# Patient Record
Sex: Male | Born: 1955 | Race: White | Hispanic: No | Marital: Married | Smoking: Former smoker
Health system: Southern US, Community
[De-identification: ages and names within clinical notes are randomized; demographics above are authoritative.]

## PROBLEM LIST (undated history)

## (undated) DIAGNOSIS — N529 Male erectile dysfunction, unspecified: Secondary | ICD-10-CM

## (undated) DIAGNOSIS — E785 Hyperlipidemia, unspecified: Secondary | ICD-10-CM

## (undated) DIAGNOSIS — J45909 Unspecified asthma, uncomplicated: Secondary | ICD-10-CM

## (undated) DIAGNOSIS — I10 Essential (primary) hypertension: Secondary | ICD-10-CM

## (undated) DIAGNOSIS — H9193 Unspecified hearing loss, bilateral: Secondary | ICD-10-CM

## (undated) DIAGNOSIS — M76829 Posterior tibial tendinitis, unspecified leg: Secondary | ICD-10-CM

## (undated) HISTORY — PX: FINGER SURGERY: SHX640

## (undated) HISTORY — DX: Hyperlipidemia, unspecified: E78.5

## (undated) HISTORY — DX: Unspecified hearing loss, bilateral: H91.93

## (undated) HISTORY — DX: Male erectile dysfunction, unspecified: N52.9

## (undated) HISTORY — DX: Posterior tibial tendinitis, unspecified leg: M76.829

## (undated) HISTORY — DX: Essential (primary) hypertension: I10

## (undated) HISTORY — DX: Unspecified asthma, uncomplicated: J45.909

## (undated) HISTORY — PX: SPHINCTEROTOMY: SHX5279

---

## 1988-07-14 HISTORY — PX: KNEE ARTHROSCOPY W/ ACL RECONSTRUCTION: SHX1858

## 1999-02-01 ENCOUNTER — Encounter: Payer: Self-pay | Admitting: Radiology

## 1999-02-01 ENCOUNTER — Ambulatory Visit (HOSPITAL_COMMUNITY): Admission: RE | Admit: 1999-02-01 | Discharge: 1999-02-01 | Payer: Self-pay | Admitting: Radiology

## 2004-10-28 ENCOUNTER — Ambulatory Visit (HOSPITAL_COMMUNITY): Admission: RE | Admit: 2004-10-28 | Discharge: 2004-10-28 | Payer: Self-pay | Admitting: Emergency Medicine

## 2013-09-28 ENCOUNTER — Encounter: Payer: Self-pay | Admitting: Sports Medicine

## 2013-09-28 ENCOUNTER — Ambulatory Visit (INDEPENDENT_AMBULATORY_CARE_PROVIDER_SITE_OTHER): Payer: PRIVATE HEALTH INSURANCE | Admitting: Sports Medicine

## 2013-09-28 VITALS — BP 121/79 | HR 72 | Ht 79.0 in | Wt 175.0 lb

## 2013-09-28 DIAGNOSIS — M25579 Pain in unspecified ankle and joints of unspecified foot: Secondary | ICD-10-CM

## 2013-09-28 DIAGNOSIS — M76829 Posterior tibial tendinitis, unspecified leg: Secondary | ICD-10-CM

## 2013-09-28 MED ORDER — NITROGLYCERIN 0.2 MG/HR TD PT24: 0.2000 mg | MEDICATED_PATCH | Freq: Every day | TRANSDERMAL | Status: DC

## 2013-09-28 NOTE — Patient Instructions (Signed)
Thank you for coming in today  1. X ankle helix 2. Rehab exercises 3. Shoe inserts 4. Nitro patch 5. Ice after activity  Followup 6 weeks  Nitroglycerin Protocol   Apply 1/4 nitroglycerin patch to affected area daily.  Change position of patch within the affected area every 24 hours.  You may experience a headache during the first 1-2 weeks of using the patch, these should subside.  If you experience headaches after beginning nitroglycerin patch treatment, you may take your preferred over the counter pain reliever.  Another side effect of the nitroglycerin patch is skin irritation or rash related to patch adhesive.  Please notify our office if you develop more severe headaches or rash, and stop the patch.  Tendon healing with nitroglycerin patch may require 12 to 24 weeks depending on the extent of injury.  Men should not use if taking Viagra, Cialis, or Levitra.   Do not use if you have migraines or rosacea.

## 2013-09-28 NOTE — Progress Notes (Signed)
CC: Right ankle pain HPI: Patient is a very pleasant 58 year old male who presents with right ankle pain that has been there for 7 months. She was trying to get out off of a chair in the dog bumped into him. He suffered an inversion injury to the right ankle. He had immediate pain and swelling. He rested for 3 weeks and then began to wear an ASO lace up brace. He had x-rays that were reportedly negative. He has been able to return to cycling without to much difficulty but does note that he has a lot of pain at night. Pain was initially laterally but then began to be on the medial aspect of his ankle about a week after the injury and this is where his pain is now. He denies any popping or crepitance. No sensation of instability. He has done minimal ankle rehabilitation.  ROS: As above in the HPI. All other systems are stable or negative.  OBJECTIVE: APPEARANCE:  Patient in no acute distress.The patient appeared well nourished and normally developed. HEENT: No scleral icterus. Conjunctiva non-injected Resp: Non labored Skin: No rash MSK:  Right Ankle: No visible erythema or swelling. Range of motion is full in all directions. Strength is 5/5 in all directions. Stable lateral and medial ligaments; squeeze test and kleiger test unremarkable; Talar dome mildly tender over the medial corner; No pain at base of 5th MT; No tenderness over cuboid; No tenderness over N spot or navicular prominence No tenderness on posterior aspects of lateral and medial malleolus Tender over the course of the posterior tibialis tendon Able to walk 4 steps.   Foot exam:  - On inspection, there is noted to be no swelling or deformity.  - When standing, patient has pes planus with over pronation and loss of longitudinal arch. There is also a large gap between the first and second and second and third toes suggestive of breakdown of the transverse arch. - Neurovascular status normal.  MSK US: Limited ultrasound of  the right ankle was performed today. There is a large hypoechoic fluid layer overlying the posterior tibialis tendon in both transverse and longitudinal views. There is no evidence of tendon tear or split. Talar dome visualized with no evidence of osteochondral defect from medial to lateral.   ASSESSMENT: #1. Persistent right medial ankle pain after inversion injury. He definitely has findings today of posterior tibialis tendinopathy but the most concerning feature is the fact that he has pain at night which raises concern for OCD lesion of talar dome  PLAN: We will start by rehabbing posterior tibial tendinopathy. He was given heel raise exercises. We will try a body helix compression sleeve to reduce swelling. He should ice after exercise. Nitroglycerin protocol. Sport insoles with scaphoid pad. Followup 6 weeks. May need to consider MRI if he is not improving to rule out talar dome lesion.

## 2013-11-09 ENCOUNTER — Ambulatory Visit (INDEPENDENT_AMBULATORY_CARE_PROVIDER_SITE_OTHER): Payer: PRIVATE HEALTH INSURANCE | Admitting: Sports Medicine

## 2013-11-09 ENCOUNTER — Encounter: Payer: Self-pay | Admitting: Sports Medicine

## 2013-11-09 VITALS — BP 132/75 | Ht 69.0 in | Wt 175.0 lb

## 2013-11-09 DIAGNOSIS — M76829 Posterior tibial tendinitis, unspecified leg: Secondary | ICD-10-CM

## 2013-11-09 DIAGNOSIS — J45990 Exercise induced bronchospasm: Secondary | ICD-10-CM | POA: Insufficient documentation

## 2013-11-09 MED ORDER — BECLOMETHASONE DIPROPIONATE 80 MCG/ACT IN AERS: 2.0000 | INHALATION_SPRAY | Freq: Two times a day (BID) | RESPIRATORY_TRACT | Status: DC | PRN

## 2013-11-09 NOTE — Patient Instructions (Signed)
Thank you for coming in today  1. Continue NTG patch 2. Try to build in more ankle rehab 3. Try Qvar around exercise 2 puffs 4. Followup in 6 weeks

## 2013-11-09 NOTE — Progress Notes (Signed)
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 Fortunately he says his right ankle is doing much better. Recall that we saw him for persistent medial ankle pain 7 months after inversion injury. He states that the pain is at least 50% better. Is becoming more localized just below and posterior to the medial malleolus. Now he'll he has pain with movement has no further pain at rest. He is using the nitroglycerin as well as a sport insoles. He is not doing a rehabilitation as much as he would like to. \  He has had long-standing exercise-induced asthma. He has been chronically on an albuterol inhaler or prior to exercise to control his. He notes that more recently he has had worsening of his symptoms. He will have days where he has difficulty breathing when he is out cycling but he will always have a couple hours after he finishes his ride where he has a lot of coughing. He would like to discuss further treatment options. He tried Singulair but says that it made him very fatigued.   ROS: As above in the HPI. All other systems are stable or negative.  OBJECTIVE: APPEARANCE:  Patient in no acute distress.The patient appeared well nourished and normally developed. HEENT: No scleral icterus. Conjunctiva non-injected Resp: Non labored Skin: No rash MSK:  Right Ankle: No visible erythema or swelling. Range of motion is full in all directions. Strength is 5/5 in all directions. Stable lateral and medial ligaments; squeeze test and kleiger test unremarkable; Talar dome nontender; Positive anterior drawer for laxity No sign of peroneal tendon subluxations; Negative tarsal tunnel tinel's Able to walk 4 steps.  MSK US:  not performed   ASSESSMENT:  #1. Right posterior tibialis tendinopathy, improving #2. Exercise-induced bronchospasm  PLAN #1. For the posterior tibialis tendinopathy, we will continue current treatment  plan. Continue compression sleeve, sport insoles, nitroglycerin. We are glad that he is doing so much better at this time. We will have also asked him to try to build in more of the rehabilitation exercises as this will likely help with his proprioception a regaining stability as well as treatment of the posterior tib tendinopathy.  #2. For his exercise-induced bronchospasm, add Qvar 80 mcg 2 puffs the morning prior to exercise. If this does not control his symptoms he will go up to daily use. When he returns would recommend obtaining peak flows to rule out chronic asthma.  Followup 6 weeks

## 2013-12-21 ENCOUNTER — Ambulatory Visit (INDEPENDENT_AMBULATORY_CARE_PROVIDER_SITE_OTHER): Payer: PRIVATE HEALTH INSURANCE | Admitting: Sports Medicine

## 2013-12-21 ENCOUNTER — Encounter: Payer: Self-pay | Admitting: Sports Medicine

## 2013-12-21 VITALS — BP 112/78 | Ht 69.0 in | Wt 175.0 lb

## 2013-12-21 DIAGNOSIS — M76829 Posterior tibial tendinitis, unspecified leg: Secondary | ICD-10-CM

## 2013-12-21 DIAGNOSIS — J45990 Exercise induced bronchospasm: Secondary | ICD-10-CM

## 2013-12-21 HISTORY — DX: Posterior tibial tendinitis, unspecified leg: M76.829

## 2013-12-21 MED ORDER — NITROGLYCERIN 0.2 MG/HR TD PT24: 0.2000 mg | MEDICATED_PATCH | Freq: Every day | TRANSDERMAL | Status: DC

## 2013-12-21 NOTE — Assessment & Plan Note (Signed)
On examination this is much better with no real findings  On [place] fluid in sheath has normalized  Tx 1 more month and then wean off NTG  Reck if probs

## 2013-12-21 NOTE — Progress Notes (Signed)
Peak flow readings: 470/490/500

## 2013-12-21 NOTE — Assessment & Plan Note (Signed)
Improved with treatment for exercise  However, with low baseline values he should use a PEFR to monitor levels and also do daily Tx  See if this reverses his sxs over next 2 to 3 mos

## 2013-12-21 NOTE — Patient Instructions (Signed)
Thank you for coming in today  1. Rehab exercises (especially proprioception- 1 foot, cone touch, wall bounce on 1 foot) 2. Nitro patch 3. Follow up 4-6 weeks if not completely resolved  Asthma 1. Monitor Peak flow in morning and before and after exercise. Would encourage qvar daily based off peak flow around 490 in office today.   Nitroglycerin Protocol   Apply 1/4 nitroglycerin patch to affected area daily.  Change position of patch within the affected area every 24 hours.  You may experience a headache during the first 1-2 weeks of using the patch, these should subside.  If you experience headaches after beginning nitroglycerin patch treatment, you may take your preferred over the counter pain reliever.  Another side effect of the nitroglycerin patch is skin irritation or rash related to patch adhesive.  Please notify our office if you develop more severe headaches or rash, and stop the patch.  Tendon healing with nitroglycerin patch may require 12 to 24 weeks depending on the extent of injury.  Men should not use if taking Viagra, Cialis, or Levitra.   Do not use if you have migraines or rosacea.

## 2013-12-21 NOTE — Progress Notes (Signed)
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 He has no pain at rest but can palpate some pain. With movement he has mild pain mianly just below and posterior to medial malleolus. He has trouble fitting his sports insoles into his shoes. He is not interested in thinner bikers insoles at present.  Nitroglycerin use- 6/7 days a week Rehab- 5/7 days a week and doing some proprioception  Also following up long-standing exercise-induced asthma. He did pick up qvar and is using 2 hours before exercise and then albuterol 20 minutes before. He discussed this regimen with Dr. Kendrick Fries of pulmonary. His main symptoms were coughing spells after exercise and heavy breathing during exercise. He has had a significant improvement on current regimen. Singulair had been helpful in past but produced too much fatigue.   ROS:  As above in the HPI with additions no clicking/popping/joint instability All other systems are stable or negative.  OBJECTIVE: APPEARANCE:  Patient in no acute distress.The patient appeared well nourished and normally developed. Resp: Non labored. Peak flow readings averaging 487 (expected 590) Skin: No rash or erythema over leg MSK:  Right Ankle: No visible erythema or swelling. Patient with mild tenderness just behind and below medial malleolus Range of motion is full in all directions. Eversion and plantar flexion produce some cramping just below medial malleolus. Also great toe flexion produces similar cramping. NOt reproduced on opposite foot.  Strength is 5/5 in all directions. Stable lateral and medial ligaments; squeeze test and kleiger test unremarkable; Talar dome nontender; Positive anterior drawer for laxity previously noted, minor laxity noted today Negative tarsal tunnel tinel's Able to walk 4 steps with ease  MSK [place]:   Rt ankle- shows significant reduction in hypoechoic region along posterior tibialis tendon. Also, no increased blood flow noted in tendon. Represents healing of posterior tibialis tendinopathy  ASSESSMENT:  #1. Right posterior tibialis tendinopathy, improving #2. Exercise-induced bronchospasm  PLAN #1. For the posterior tibialis tendinopathy, we will continue current treatment plan. Continue nitroglycerin and rehab exercises. Has been 2 months of therapy so will continue nitroglycerin one more month. Discussed specific home exercise program for proprioception (see avs). Follow up in 1 month if not fully recovered.   #2.  For his exercise-induced bronchospasm, suspect this is actually chronic asthma (peak flow avg 487 when expected 590). Advised patient to use Qvar on daily basis instead of prn before exercise. Follow up in 1 month or with primary care provider. Could consider formal PFTs.

## 2016-04-28 ENCOUNTER — Ambulatory Visit (INDEPENDENT_AMBULATORY_CARE_PROVIDER_SITE_OTHER): Payer: PRIVATE HEALTH INSURANCE | Admitting: Family Medicine

## 2016-04-28 ENCOUNTER — Encounter: Payer: Self-pay | Admitting: Family Medicine

## 2016-04-28 DIAGNOSIS — E785 Hyperlipidemia, unspecified: Secondary | ICD-10-CM | POA: Diagnosis not present

## 2016-04-28 NOTE — Progress Notes (Signed)
Medical Nutrition Therapy:  Appt start time: 0900 end time:  1000.  Assessment:  Primary concerns today: Weight management and cholesterol management.  (currently medication-controlled).   Timothy David is an avid cyclist, and would like some nutrition recommendations for optimizing performance as well as for cholesterol mgmt and family nutrition.  He has 3 children at home currently (oldest 9716, youngest 4910).   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  He works variable AM or evening shifts.    Learning Readiness: Ready  Usual eating pattern includes 3 meals and 1-2 snacks per day. Frequent foods and beverages include water, 2 c coffee, beer/wine 5 X wk, cereal 3 X wk, 1 boiled egg 3 X wk, protein-starch-veg dinners.  Avoided foods include none.   Usual physical activity includes cycling 30-40 mi ~4 X wk; 100-125 mi per week (6-8 hrs).  Eats ~90 min pre-ride, e.g., 1 egg, cheese, Eng muffin w/ butter, maybe with 1 slice ham.  During his ride, he has 2 dates with 2 T peanuts & 2 bottles carb replacement.    Eats out ~3 X mo; does a lot of home cooking, and usually takes dinner leftovers for meals at work the next day.   24-hr recall: (Up at 8 AM) B (8:30 AM)-   1 espresso, 3.5 c frosted Mini-Whts & Shr Wht, 1 c sk milk Snk ( AM)-    L (12:30 PM)-  2 c chx parmiagiana (2/3 c noodles), water To work 2 PM - 1 AM Snk ( PM)-   D (7 PM)-  1.5 c [place] stew, 1 apple, water Snk (1:30)-  ~1 c peanuts in shell, 12 oz beer Typical day? Yes.  Probably eats less when working.    Progress Towards Goal(s):  In progress.   Nutritional Diagnosis:  NB-1.1 Food and nutrition-related knowledge deficit As related to best dietary choices for optimizing both performance and cholesterol levels.  As evidenced by expressed interest and reported knowledge gap.    Intervention:  Education.  Handouts given during visit include:  AVS  Demonstrated degree of understanding via:  Teach  Back  Barriers to learning/adherence to lifestyle change: No significant barriers perceived.  Pt is highly motivated.   Monitoring/Evaluation:  Dietary intake, exercise, and body weight prn.

## 2016-04-28 NOTE — Patient Instructions (Addendum)
-   On any rides >60 min, plan on carb replacement during your ride.  60 grams of carb per hour is the max most people can absorb, so it probably doesn't make sense to consume a lot more than this per hour.    - Here is a carb-protein drink you can use for riding:  2 tbsp Pro-stat pro supplement + 10 oz [employer] Foods 365 lemonade; dilute with water to ~20-24 oz total.    - This drink provides roughly 3:1 carb:protein.  Recommended for aerobic exercise is a ratio of at least 3:1, and up to 4:1.   - Post-race eating within 30 min is ideal.  Make sure it's a good mix of carb and protein.   - Consider using more beets, especially for a couple weeks leading up to any significant riding event.   - [place] also has high levels of natural nitrates; google "nitrate levels of veg's/plant foods." - Protein intake:  Aim for minimum of 25 grams per meal.  (1 oz of meat/fish/poultry = 7 grams.) - Popcorn:  Fine for an occasional snack; best if you pop your own from dry, using olive oil.  (Avoid trans fat.) - Fatigue on hills:    - You want to be as lean as possible, without sacrificing power.    - Some weight training for quads and gluts will also be helpful.    - Obtain twice as many veg's as protein or carbohydrate foods for both lunch and dinner.  - Pay attention to the amount of veg's you get at each meal.    - Helpful for both wt management and cholesterol control.   - Make a list of 7-10 dinner meals that taste good, are relatively quick and easy to prepare, and that meet your nutritional needs.  Use this as a basis for shopping, so you can make one of these meals any time.  Bring your list to your follow-up appointment for review.

## 2016-06-26 DIAGNOSIS — R0609 Other forms of dyspnea: Principal | ICD-10-CM

## 2016-06-26 DIAGNOSIS — R0600 Dyspnea, unspecified: Secondary | ICD-10-CM | POA: Insufficient documentation

## 2016-06-29 DIAGNOSIS — R0789 Other chest pain: Secondary | ICD-10-CM | POA: Insufficient documentation

## 2016-07-01 ENCOUNTER — Encounter: Payer: Self-pay | Admitting: Interventional Cardiology

## 2016-07-01 ENCOUNTER — Ambulatory Visit (INDEPENDENT_AMBULATORY_CARE_PROVIDER_SITE_OTHER): Payer: PRIVATE HEALTH INSURANCE | Admitting: Interventional Cardiology

## 2016-07-01 VITALS — BP 118/76 | HR 85 | Ht 69.0 in | Wt 170.4 lb

## 2016-07-01 DIAGNOSIS — N529 Male erectile dysfunction, unspecified: Secondary | ICD-10-CM

## 2016-07-01 DIAGNOSIS — R0609 Other forms of dyspnea: Secondary | ICD-10-CM

## 2016-07-01 DIAGNOSIS — E784 Other hyperlipidemia: Secondary | ICD-10-CM

## 2016-07-01 DIAGNOSIS — R0789 Other chest pain: Secondary | ICD-10-CM | POA: Diagnosis not present

## 2016-07-01 DIAGNOSIS — E7849 Other hyperlipidemia: Secondary | ICD-10-CM

## 2016-07-01 NOTE — Progress Notes (Signed)
Cardiology Office Note    Date:  07/01/2016   ID:  Timothy David, DOB [birth year], MRN [id number]  PCP:  Garlan FillersPATERSON,DANIEL G, MD  Cardiologist: Lesleigh NoeHenry W Trice Aspinall III, MD   Chief Complaint  Patient presents with  . 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 He cycles up to 2 hours 4 or 5 times per week. Over the past year he has had episodes of extreme dyspnea at heart rates above 160 bpm. He feels as though he can't inhale. There is no discomfort. Once present he has to stop exerting himself. It gradually resolves over a minute or 2. There is no associated discomfort. He has never lightheaded or dizzy with it. There is no palpitation or sensation of arrhythmia. The situation is different than what he feels with "asthma".    Past Medical History:  Diagnosis Date  . Asthma    exercise - induced  . Dyslipidemia   . ED (erectile dysfunction)   . Hearing loss, bilateral   . Hypertension, benign   . Tibialis posterior tendinitis 12/21/2013   Documented on US Tx with NTG     Past Surgical History:  Procedure Laterality Date  . FINGER SURGERY     left index finger dislocation  . KNEE ARTHROSCOPY W/ ACL RECONSTRUCTION  1990  . SPHINCTEROTOMY     perirectal abscess incision and drainage    Current Medications: Outpatient Medications Prior to Visit  Medication Sig Dispense Refill  . albuterol (PROVENTIL HFA;VENTOLIN HFA) 108 (90 Base) MCG/ACT inhaler Inhale 2 puffs into the lungs every 4 (four) hours as needed for wheezing or shortness of breath.    . beclomethasone (QVAR) 80 MCG/ACT inhaler Inhale 2 puffs into the lungs 2 (two) times daily as needed (prior to exercise for EIB). 1 Inhaler 1  . finasteride (PROSCAR) 5 MG tablet Take 5 mg  by mouth daily.    . montelukast (SINGULAIR) 10 MG tablet Take 10 mg by mouth at bedtime.    . pravastatin (PRAVACHOL) 10 MG tablet Take 10 mg by mouth daily.    Marland Kitchen. SILDENAFIL CITRATE PO Take 20 mg by mouth. TAKE 1-5 TABS BY MOUTH DAILY PRN    . tamsulosin (FLOMAX) 0.4 MG CAPS capsule Take 0.4 mg by mouth.    . zaleplon (SONATA) 10 MG capsule Take 10 mg by mouth at bedtime as needed for sleep.     No facility-administered medications prior to visit.      Allergies:   Patient has no known allergies.   Social History   Social History  . Marital status: Married    Spouse name: N/A  . Number of children: N/A  . Years of education: N/A   Social History Main Topics  . Smoking status: Former Smoker    Packs/day: 3.00    Types: Cigarettes  . Smokeless tobacco: Never Used  . Alcohol use 1.2 oz/week    2 Cans of beer per week     Comment: 4 days a week  . Drug use: No  . Sexual activity: Yes     Comment: [name]   Other Topics Concern  . None   Social History Narrative  . None     Family History:  The patient's  family history includes Cancer in his mother and sister; Healthy in his brother, sister, sister, sister, and sister; Heart disease in his father; Hypertension in his father; Other in his sister.   ROS:   Please see the history of present illness.    No real complaints. He has lost daily 30 pounds since he has been exercising. He does have exercise-induced asthma. Also has a history of hyperlipidemia, erectile dysfunction, and prior history of elevated blood pressure before weight loss.  All other systems reviewed and are negative.   PHYSICAL EXAM:   VS:  BP 118/76 (BP Location: Left Arm)   Pulse 85   Ht 5\' 9"  (1.753 m)   Wt 170 lb 6.4 oz (77.3 kg)   BMI 25.16 kg/m    GEN: Well nourished, well developed, in no acute distress  HEENT: normal  Neck: no JVD, carotid bruits, or masses Cardiac: RRR; no murmurs, rubs, or gallops,no edema  Respiratory:  clear to auscultation  bilaterally, normal work of breathing GI: soft, nontender, nondistended, + BS MS: no deformity or atrophy  Skin: warm and dry, no rash Neuro:  Alert and Oriented x 3, Strength and sensation are intact Psych: euthymic mood, full affect  Wt Readings from Last 3 Encounters:  07/01/16 170 lb 6.4 oz (77.3 kg)  04/28/16 170 lb (77.1 kg)  12/21/13 175 lb (79.4 kg)      Studies/Labs Reviewed:   EKG:  EKG  Normal sinus rhythm with normal appearance.  Recent Labs: No results found for requested labs within last 8760 hours.   Lipid Panel No results found for: CHOL, TRIG, HDL, CHOLHDL, VLDL, LDLCALC, LDLDIRECT  Additional studies/ records that were reviewed today include:  No functional testing or prior chest CT studies.    ASSESSMENT:    1. Dyspnea on exertion   2. Chest discomfort   3. Other hyperlipidemia   4. Erectile dysfunction, unspecified erectile dysfunction type      PLAN:  In order of problems listed above:  1. This symptom occurs at high-end exercise usually when heart rates are greater than 160 bpm and have been sustained in that range for some time. There is no chest discomfort. We'll perform a stress Myoview seeking to achieve a heart rate of at least 163 prior to injecting the radionuclide. If he is unable to achieve the target heart rate for injection, we should converted to Lexiscan. 2. Occasional tightness in the chest CT feels is related to asthma as a goes away with his bronchodilator. 3. Current lipid profile is [facility].    Medication Adjustments/Labs and Tests Ordered: Current medicines are reviewed at length with the patient today.  Concerns regarding medicines are outlined above.  Medication changes, Labs and Tests ordered today are listed in the Patient Instructions below. Patient Instructions  Medication Instructions:  None  Labwork: None  Testing/Procedures: Your physician has requested that you  have en exercise stress myoview. For further information please visit [email or web address]. Please follow instruction sheet, as given.   Follow-Up: Your physician recommends that you schedule a follow-up appointment as needed with Dr. Katrinka BlazingSmith.    Any Other Special Instructions Will Be Listed Below (If Applicable).     If you need a refill on your cardiac medications before your next appointment, please call your pharmacy.      Signed, Lesleigh NoeHenry W Laiyla Slagel III, MD  07/01/2016 3:53 PM    [facility] [address], [place], [place]  [address] Phone: [phone]; Fax: [phone]

## 2016-07-01 NOTE — Patient Instructions (Signed)
Medication Instructions:  None  Labwork: None  Testing/Procedures: Your physician has requested that you have en exercise stress myoview. For further information please visit [email or web address]. Please follow instruction sheet, as given.   Follow-Up: Your physician recommends that you schedule a follow-up appointment as needed with Dr. Smith.    Any Other Special Instructions Will Be Listed Below (If Applicable).     If you need a refill on your cardiac medications before your next appointment, please call your pharmacy.   

## 2016-07-10 ENCOUNTER — Telehealth (HOSPITAL_COMMUNITY): Payer: Self-pay | Admitting: *Deleted

## 2016-07-10 NOTE — Telephone Encounter (Signed)
Left message on voicemail in reference to upcoming appointment scheduled for 07/15/16. Phone number given for a call back so details instructions can be given. Ricky AlaSmith, Sie Formisano Jacqueline

## 2016-07-15 ENCOUNTER — Ambulatory Visit (HOSPITAL_COMMUNITY): Payer: PRIVATE HEALTH INSURANCE | Attending: Cardiovascular Disease

## 2016-07-15 DIAGNOSIS — I10 Essential (primary) hypertension: Secondary | ICD-10-CM | POA: Diagnosis not present

## 2016-07-15 DIAGNOSIS — R0609 Other forms of dyspnea: Secondary | ICD-10-CM | POA: Diagnosis not present

## 2016-07-15 DIAGNOSIS — R0789 Other chest pain: Secondary | ICD-10-CM | POA: Diagnosis present

## 2016-07-15 LAB — MYOCARDIAL PERFUSION IMAGING
CHL CUP MPHR: 159 {beats}/min
CHL CUP NUCLEAR SDS: 0
CHL CUP NUCLEAR SSS: 0
CHL CUP RESTING HR STRESS: 62 {beats}/min
CSEPED: 13 min
CSEPEW: 16.2 METS
Exercise duration (sec): 30 s
LHR: 0.3
LV dias vol: 126 mL (ref 62–150)
LV sys vol: 55 mL
Peak HR: 171 {beats}/min
Percent HR: 107 %
SRS: 0
TID: 0.99

## 2016-07-15 MED ORDER — TECHNETIUM TC 99M TETROFOSMIN IV KIT
10.4000 | PACK | Freq: Once | INTRAVENOUS | Status: AC | PRN
  Administered 2016-07-15: 10.4 via INTRAVENOUS
  Filled 2016-07-15: qty 11

## 2016-07-15 MED ORDER — TECHNETIUM TC 99M TETROFOSMIN IV KIT
31.9000 | PACK | Freq: Once | INTRAVENOUS | Status: AC | PRN
  Administered 2016-07-15: 31.9 via INTRAVENOUS
  Filled 2016-07-15: qty 32

## 2016-07-21 ENCOUNTER — Telehealth: Payer: Self-pay | Admitting: Interventional Cardiology

## 2016-07-21 NOTE — Telephone Encounter (Signed)
Informed pt of stress test results. Pt verbalized understanding. 

## 2016-07-21 NOTE — Telephone Encounter (Signed)
New Message     [facility]

## 2016-08-11 ENCOUNTER — Encounter: Payer: Self-pay | Admitting: Pulmonary Disease

## 2016-08-11 ENCOUNTER — Ambulatory Visit (INDEPENDENT_AMBULATORY_CARE_PROVIDER_SITE_OTHER): Payer: PRIVATE HEALTH INSURANCE | Admitting: Pulmonary Disease

## 2016-08-11 VITALS — BP 128/72 | HR 78 | Ht 69.0 in | Wt 174.0 lb

## 2016-08-11 DIAGNOSIS — J45990 Exercise induced bronchospasm: Secondary | ICD-10-CM

## 2016-08-11 DIAGNOSIS — R011 Cardiac murmur, unspecified: Secondary | ICD-10-CM | POA: Diagnosis not present

## 2016-08-11 DIAGNOSIS — R0602 Shortness of breath: Secondary | ICD-10-CM

## 2016-08-11 NOTE — Assessment & Plan Note (Signed)
Timothy David has experienced shortness of breath on high intensity exercise mostly infrequently but of an increasing rate of occurrence over the last several months. On physical exam today his lungs are clear to auscultation and a recent cardiac stress test was within normal limits. He does have a slight flow murmur on exam, see below.  I explained to him today that the differential diagnosis either includes exercise-induced bronchospasm or some sort of vocal cord dysfunction. Because of the raspiness he feels in his voice after exercising this may be the case.  Plan: The best test moving forward is a cardiopulmonary exercise test. This will give us pre-and post spirometry testing as well as will give a full assessment of his pulmonary function. Continue QVar for now Continue Albuterol for now If there is evidence of variable upper airflow obstruction we can [facility]

## 2016-08-11 NOTE — Assessment & Plan Note (Signed)
Slight aortic area heart murmur heard on exam.  Plan: Echocardiogram to evaluate for aortic stenosis or mycocardial hypertrophy

## 2016-08-11 NOTE — Assessment & Plan Note (Signed)
As above.

## 2016-08-11 NOTE — Patient Instructions (Signed)
We will arrange for a cardiopulmonary exercise test to evaluate the cause of shortness of breath you have experienced with high intensity exercise  We will also arrange for an echocardiogram to evaluate the heart murmur and call you with the results  We will see you back in 3-4 weeks or sooner if needed; overbook OK

## 2016-08-11 NOTE — Progress Notes (Signed)
Subjective:    Patient ID: Timothy David, male    DOB: [birth year], 61 y.o.   MRN: [id number]  HPI Chief Complaint  Patient presents with  . Advice Only    self referral for exercise enduced asthma- worsening dyspnea with high exertion (heartrate at or above 160) while cycling.    Carroll is here to see me today for evaluation of shortness of breath with high intensity exercise. [place] and smoked briefly when he was in his early 46s but never on a regular basis. He says that for most of his life he has experienced exercise-induced bronchospasm but he's never had asthma. He says that he had allergic rhinitis as a child and as an adult which is brought on typically by dust and pollen.  For most of his life he's managed his exercise induced bronchospasm with albuterol. He started cycling more regularly and intensely about 4 years ago and he has noticed a slight uptake in symptoms since then.  He says that he typically pretreats with albuterol prior to exercising. For the last 18 months cc Qvar 160 g at night and then on some days prior to long exercise routine. Specifically he says that he will experience a mild sensation of shortness of breath and chest tightness a few minutes into the cycling ride. When his heart rate is in the 130 to 150 bpm range he typically does not have symptoms of shortness of breath. However, sometimes when his heart rate goes to the 160 range he will begin to experience chest tightness and shortness of breath. He has to back off on his cycling cadence and intensity of cycling in the symptom will go away in about 2-3 minutes. He says that he also experienced this while performing vigorous activity at work recently. In general, he says that this is an infrequent occurrence in that typically when his heart rate Hicks the 160 bpm range she does not have this problem, but overall it has occurred more frequently in the last 6 months.  He had a cardiac stress test  which showed no evidence of coronary ischemia.    Past Medical History:  Diagnosis Date  . Asthma    exercise - induced  . Dyslipidemia   . ED (erectile dysfunction)   . Hearing loss, bilateral   . Hypertension, benign   . Tibialis posterior tendinitis 12/21/2013   Documented on [place] Tx with NTG      Family History  Problem Relation Age of Onset  . Cancer Mother     lung  . Heart disease Father   . Hypertension Father   . Cancer Sister     breast  . Other Sister     Takotsubo cardiomyopathy  . Healthy Brother   . Healthy Sister   . Healthy Sister   . Healthy Sister   . Healthy Sister      Social History   Social History  . Marital status: Married    Spouse name: N/A  . Number of children: N/A  . Years of education: N/A   Occupational History  . Not on file.   Social History Main Topics  . Smoking status: Former Smoker    Packs/day: 0.20    Years: 5.00    Types: Cigarettes  . Smokeless tobacco: Never Used     Comment: social smoker  . Alcohol use 1.2 oz/week    2 Cans of beer per week     Comment: 4  days a week  . Drug use: No  . Sexual activity: Yes     Comment: [name]   Other Topics Concern  . Not on file   Social History Narrative  . No narrative on file     No Known Allergies   Outpatient Medications Prior to Visit  Medication Sig Dispense Refill  . albuterol (PROVENTIL HFA;VENTOLIN HFA) 108 (90 Base) MCG/ACT inhaler Inhale 2 puffs into the lungs every 4 (four) hours as needed for wheezing or shortness of breath. Uses before exercise    . beclomethasone (QVAR) 80 MCG/ACT inhaler Inhale 2 puffs into the lungs 2 (two) times daily as needed (prior to exercise for EIB). (Patient taking differently: Inhale 2 puffs into the lungs 2 (two) times daily. ) 1 Inhaler 1  . finasteride (PROSCAR) 5 MG tablet Take 5 mg by mouth daily.    . pravastatin (PRAVACHOL) 10 MG tablet Take 10 mg by mouth daily.    Marland Kitchen. SILDENAFIL CITRATE PO Take 20 mg by mouth. TAKE 1-5  TABS BY MOUTH DAILY PRN    . tamsulosin (FLOMAX) 0.4 MG CAPS capsule Take 0.4 mg by mouth.    . zaleplon (SONATA) 10 MG capsule Take 10 mg by mouth at bedtime as needed for sleep.    Marland Kitchen. aspirin 325 MG tablet Take 325 mg by mouth at bedtime.    . montelukast (SINGULAIR) 10 MG tablet Take 10 mg by mouth daily as needed.      No facility-administered medications prior to visit.       Review of Systems  Constitutional: Negative for fever and unexpected weight change.  HENT: Positive for congestion and voice change. Negative for dental problem, ear pain, nosebleeds, postnasal drip, rhinorrhea, sinus pressure, sneezing, sore throat and trouble swallowing.   Eyes: Negative for redness and itching.  Respiratory: Positive for cough and shortness of breath. Negative for chest tightness and wheezing.   Cardiovascular: Negative for palpitations and leg swelling.  Gastrointestinal: Negative for nausea and vomiting.  Genitourinary: Negative for dysuria.  Musculoskeletal: Negative for joint swelling.  Skin: Negative for rash.  Neurological: Negative for headaches.  Hematological: Does not bruise/bleed easily.  Psychiatric/Behavioral: Negative for dysphoric mood. The patient is not nervous/anxious.        Objective:   Physical Exam  Vitals:   08/11/16 1441  BP: 128/72  Pulse: 78  SpO2: 99%  Weight: 174 lb (78.9 kg)  Height: 5\' 9"  (1.753 m)   Gen: well appearing, no acute distress HENT: NCAT, OP clear, neck supple without masses Eyes: PERRL, EOMi Lymph: no cervical lymphadenopathy PULM: CTA B CV: RRR, I/VI SEM RUSB, no JVD GI: BS+, soft, nontender, no hsm Derm: no rash or skin breakdown MSK: normal bulk and tone Neuro: A&Ox4, CN II-XII intact, strength 5/5 in all 4 extremities Psyche: normal mood and affect   January 2018 clear stress test showed an ejection fraction of 56%, no ST segment deviation, blood pressure was normal.     Assessment & Plan:  Exercise-induced  asthma Timothy David has experienced shortness of breath on high intensity exercise mostly infrequently but of an increasing rate of occurrence over the last several months. On physical exam today his lungs are clear to auscultation and a recent cardiac stress test was within normal limits. He does have a slight flow murmur on exam, see below.  I explained to him today that the differential diagnosis either includes exercise-induced bronchospasm or some sort of vocal cord dysfunction. Because of the raspiness he feels  in his voice after exercising this may be the case.  Plan: The best test moving forward is a cardiopulmonary exercise test. This will give [place] pre-and post spirometry testing as well as will give a full assessment of his pulmonary function. Continue QVar for now Continue Albuterol for now If there is evidence of variable upper airflow obstruction we can refer him for speech therapy at [facility]  Dyspnea As above  Heart murmur Slight aortic area heart murmur heard on exam.  Plan: Echocardiogram to evaluate for aortic stenosis or mycocardial hypertrophy    Current Outpatient Prescriptions:  .  albuterol (PROVENTIL HFA;VENTOLIN HFA) 108 (90 Base) MCG/ACT inhaler, Inhale 2 puffs into the lungs every 4 (four) hours as needed for wheezing or shortness of breath. Uses before exercise, Disp: , Rfl:  .  beclomethasone (QVAR) 80 MCG/ACT inhaler, Inhale 2 puffs into the lungs 2 (two) times daily as needed (prior to exercise for EIB). (Patient taking differently: Inhale 2 puffs into the lungs 2 (two) times daily. ), Disp: 1 Inhaler, Rfl: 1 .  finasteride (PROSCAR) 5 MG tablet, Take 5 mg by mouth daily., Disp: , Rfl:  .  pravastatin (PRAVACHOL) 10 MG tablet, Take 10 mg by mouth daily., Disp: , Rfl:  .  SILDENAFIL CITRATE PO, Take 20 mg by mouth. TAKE 1-5 TABS BY MOUTH DAILY PRN, Disp: , Rfl:  .  tamsulosin (FLOMAX) 0.4 MG CAPS capsule, Take 0.4 mg by mouth., Disp: , Rfl:  .  zaleplon (SONATA)  10 MG capsule, Take 10 mg by mouth at bedtime as needed for sleep., Disp: , Rfl:

## 2016-08-12 ENCOUNTER — Telehealth (HOSPITAL_COMMUNITY): Payer: Self-pay | Admitting: Pulmonary Disease

## 2016-08-14 NOTE — Telephone Encounter (Signed)
08/12/16 Left Message - Called pt and lmsg for him to CB to get scheduled for an echo. ANYONE CAN SCHEDULE    By Jeannene Tschetter A Rosalee Tolley           Close PreviousNext    

## 2016-08-25 ENCOUNTER — Ambulatory Visit (HOSPITAL_COMMUNITY): Payer: PRIVATE HEALTH INSURANCE | Attending: Pulmonary Disease

## 2016-08-25 DIAGNOSIS — R0602 Shortness of breath: Secondary | ICD-10-CM | POA: Insufficient documentation

## 2016-08-26 ENCOUNTER — Ambulatory Visit (HOSPITAL_COMMUNITY): Payer: PRIVATE HEALTH INSURANCE | Attending: Cardiology

## 2016-08-26 ENCOUNTER — Other Ambulatory Visit: Payer: Self-pay

## 2016-08-26 DIAGNOSIS — R011 Cardiac murmur, unspecified: Secondary | ICD-10-CM | POA: Insufficient documentation

## 2016-08-28 DIAGNOSIS — R0600 Dyspnea, unspecified: Secondary | ICD-10-CM | POA: Diagnosis not present

## 2016-09-04 ENCOUNTER — Ambulatory Visit (INDEPENDENT_AMBULATORY_CARE_PROVIDER_SITE_OTHER): Payer: PRIVATE HEALTH INSURANCE | Admitting: Pulmonary Disease

## 2016-09-04 ENCOUNTER — Encounter: Payer: Self-pay | Admitting: Pulmonary Disease

## 2016-09-04 DIAGNOSIS — J45990 Exercise induced bronchospasm: Secondary | ICD-10-CM

## 2016-09-04 NOTE — Assessment & Plan Note (Signed)
Timothy David has mild exercise-induced bronchospasm but has demonstrated from his cardiopulmonary exercise test this is certainly not severe and in fact during that study his ventilation improved.  Overall his aerobic capacity is outstanding, and there is no clear cardiopulmonary limitation. I believe that there is some degree of vocal cord dysfunction contributing to the dyspnea he was experiencing on the bike last year. However, there is no reason why he should not be able to continue exercising at a high level.  Plan: Stop Qvar Keep albuterol to use on an as-needed basis Follow-up with us if bronchospasm becomes a problem.

## 2016-09-04 NOTE — Progress Notes (Signed)
   Subjective:    Patient ID: Timothy David, male    DOB: [birth year], 61 y.o.   MRN: [id number]  Synopsis: Referred in 2018 for evaluation of shortness of breath in the setting of exercise. Has a history of exercise-induced bronchospasm.   HPI  Chief Complaint  Patient presents with  . Follow-up    3-4 follow up. Denies any chest pain or SOB.     Timothy David has been feeling well.  He says that he worked hard today on the bike and he has been feeling normal with that.  No chest tightness. He still notes some throat burning from time to time and chest burning with heavy exertion. However, he was able to maintain a heart rate in the 170s today without any significant shortness of breath.    Past Medical History:  Diagnosis Date  . Asthma    exercise - induced  . Dyslipidemia   . ED (erectile dysfunction)   . Hearing loss, bilateral   . Hypertension, benign   . Tibialis posterior tendinitis 12/21/2013   Documented on US Tx with NTG      Review of Systems     Objective:   Physical Exam Vitals:   09/04/16 1617  BP: 126/80  Pulse: 76  SpO2: 96%  Weight: 174 lb (78.9 kg)  Height: 5\' 9"  (1.753 m)   RA  Gen: well appearing HENT: OP clear, TM's clear, neck supple PULM: CTA B, normal percussion CV: RRR, slight systolic murmur, trace edema GI: BS+, soft, nontender Derm: no cyanosis or rash Psyche: normal mood and affect   Cardiopulmonary exercise testing in 2018 showed no evidence of bronchospasm or worsening ventilation post exercise. VO2 max was 45. O2 saturation was normal during the entire study.  Echocardiogram February 2018 normal.    Assessment & Plan:  Exercise-induced asthma Timothy David has mild exercise-induced bronchospasm but has demonstrated from his cardiopulmonary exercise test this is certainly not severe and in fact during that study his ventilation improved.  Overall his aerobic capacity is outstanding, and there is no clear cardiopulmonary limitation. I  believe that there is some degree of vocal cord dysfunction contributing to the dyspnea he was experiencing on the bike last year. However, there is no reason why he should not be able to continue exercising at a high level.  Plan: Stop Qvar Keep albuterol to use on an as-needed basis Follow-up with us if bronchospasm becomes a problem.    Current Outpatient Prescriptions:  .  finasteride (PROSCAR) 5 MG tablet, Take 5 mg by mouth daily., Disp: , Rfl:  .  pravastatin (PRAVACHOL) 10 MG tablet, Take 10 mg by mouth daily., Disp: , Rfl:  .  SILDENAFIL CITRATE PO, Take 20 mg by mouth. TAKE 1-5 TABS BY MOUTH DAILY PRN, Disp: , Rfl:  .  tamsulosin (FLOMAX) 0.4 MG CAPS capsule, Take 0.4 mg by mouth., Disp: , Rfl:  .  zaleplon (SONATA) 10 MG capsule, Take 10 mg by mouth at bedtime as needed for sleep., Disp: , Rfl:

## 2016-09-04 NOTE — Patient Instructions (Signed)
Okay from my standpoint to stop Qvar Use albuterol as needed for bronchospasm induced shortness of breath and chest discomfort We will see you back on an as-needed basis

## 2019-02-05 ENCOUNTER — Emergency Department (HOSPITAL_COMMUNITY)
Admission: EM | Admit: 2019-02-05 | Discharge: 2019-02-05 | Disposition: A | Discharge status: Home / Self Care | Payer: PRIVATE HEALTH INSURANCE | Attending: Emergency Medicine | Admitting: Emergency Medicine

## 2019-02-05 ENCOUNTER — Encounter (HOSPITAL_COMMUNITY): Payer: Self-pay | Admitting: *Deleted

## 2019-02-05 ENCOUNTER — Other Ambulatory Visit: Payer: Self-pay

## 2019-02-05 ENCOUNTER — Emergency Department (HOSPITAL_COMMUNITY): Payer: PRIVATE HEALTH INSURANCE

## 2019-02-05 DIAGNOSIS — R55 Syncope and collapse: Secondary | ICD-10-CM

## 2019-02-05 DIAGNOSIS — Z79899 Other long term (current) drug therapy: Secondary | ICD-10-CM | POA: Insufficient documentation

## 2019-02-05 DIAGNOSIS — Y999 Unspecified external cause status: Secondary | ICD-10-CM | POA: Insufficient documentation

## 2019-02-05 DIAGNOSIS — Y929 Unspecified place or not applicable: Secondary | ICD-10-CM | POA: Insufficient documentation

## 2019-02-05 DIAGNOSIS — S42022A Displaced fracture of shaft of left clavicle, initial encounter for closed fracture: Secondary | ICD-10-CM | POA: Diagnosis not present

## 2019-02-05 DIAGNOSIS — Z87891 Personal history of nicotine dependence: Secondary | ICD-10-CM | POA: Diagnosis not present

## 2019-02-05 DIAGNOSIS — I10 Essential (primary) hypertension: Secondary | ICD-10-CM | POA: Diagnosis not present

## 2019-02-05 DIAGNOSIS — J45909 Unspecified asthma, uncomplicated: Secondary | ICD-10-CM | POA: Diagnosis not present

## 2019-02-05 DIAGNOSIS — Y9355 Activity, bike riding: Secondary | ICD-10-CM | POA: Diagnosis not present

## 2019-02-05 DIAGNOSIS — S4992XA Unspecified injury of left shoulder and upper arm, initial encounter: Secondary | ICD-10-CM | POA: Diagnosis present

## 2019-02-05 LAB — CBC WITH DIFFERENTIAL/PLATELET
Abs Immature Granulocytes: 0.02 10*3/uL (ref 0.00–0.07)
Basophils Absolute: 0 10*3/uL (ref 0.0–0.1)
Basophils Relative: 0 %
Eosinophils Absolute: 0 10*3/uL (ref 0.0–0.5)
Eosinophils Relative: 0 %
HCT: 44.2 % (ref 39.0–52.0)
Hemoglobin: 14.8 g/dL (ref 13.0–17.0)
Immature Granulocytes: 0 %
Lymphocytes Relative: 10 %
Lymphs Abs: 0.7 10*3/uL (ref 0.7–4.0)
MCH: 32.4 pg (ref 26.0–34.0)
MCHC: 33.5 g/dL (ref 30.0–36.0)
MCV: 96.7 fL (ref 80.0–100.0)
Monocytes Absolute: 0.6 10*3/uL (ref 0.1–1.0)
Monocytes Relative: 8 %
Neutro Abs: 5.9 10*3/uL (ref 1.7–7.7)
Neutrophils Relative %: 82 %
Platelets: 181 10*3/uL (ref 150–400)
RBC: 4.57 MIL/uL (ref 4.22–5.81)
RDW: 11.6 % (ref 11.5–15.5)
WBC: 7.1 10*3/uL (ref 4.0–10.5)
nRBC: 0 % (ref 0.0–0.2)

## 2019-02-05 LAB — COMPREHENSIVE METABOLIC PANEL
ALT: 22 U/L (ref 0–44)
AST: 21 U/L (ref 15–41)
Albumin: 4.3 g/dL (ref 3.5–5.0)
Alkaline Phosphatase: 42 U/L (ref 38–126)
Anion gap: 9 (ref 5–15)
BUN: 28 mg/dL — ABNORMAL HIGH (ref 8–23)
CO2: 27 mmol/L (ref 22–32)
Calcium: 9.6 mg/dL (ref 8.9–10.3)
Chloride: 106 mmol/L (ref 98–111)
Creatinine, Ser: 1.05 mg/dL (ref 0.61–1.24)
GFR calc Af Amer: >60 mL/min (ref >60)
GFR calc non Af Amer: >60 mL/min (ref >60)
Glucose, Bld: 126 mg/dL — ABNORMAL HIGH (ref 70–99)
Potassium: 4.4 mmol/L (ref 3.5–5.1)
Sodium: 142 mmol/L (ref 135–145)
Total Bilirubin: 0.7 mg/dL (ref 0.3–1.2)
Total Protein: 7.1 g/dL (ref 6.5–8.1)

## 2019-02-05 LAB — TROPONIN I (HIGH SENSITIVITY): Troponin I (High Sensitivity): 7 ng/L (ref <18)

## 2019-02-05 LAB — CBG MONITORING, ED: Glucose-Capillary: 99 mg/dL (ref 70–99)

## 2019-02-05 LAB — CK: Total CK: 103 U/L (ref 49–397)

## 2019-02-05 MED ORDER — SODIUM CHLORIDE 0.9 % IV BOLUS
1000.0000 mL | Freq: Once | INTRAVENOUS | Status: AC
  Administered 2019-02-05: 1000 mL via INTRAVENOUS

## 2019-02-05 MED ORDER — OXYCODONE-ACETAMINOPHEN 5-325 MG PO TABS
1.0000 | ORAL_TABLET | Freq: Once | ORAL | Status: AC
  Administered 2019-02-05: 1 via ORAL
  Filled 2019-02-05: qty 1
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
## 2019-02-05 NOTE — ED Notes (Signed)
Sling placed on left upper ext for support by ortho tech.

## 2019-08-31 ENCOUNTER — Ambulatory Visit: Payer: PRIVATE HEALTH INSURANCE

## 2020-04-11 ENCOUNTER — Ambulatory Visit: Payer: PRIVATE HEALTH INSURANCE | Attending: Internal Medicine

## 2020-04-11 DIAGNOSIS — Z23 Encounter for immunization: Secondary | ICD-10-CM

## 2020-04-11 NOTE — Progress Notes (Signed)
   Covid-19 Vaccination Clinic  Name:  Timothy David    MRN: [id number] DOB: March 11, 1956  04/11/2020  Timothy David was observed post Covid-19 immunization for 15 minutes without incident. He was provided with Vaccine Information Sheet and instruction to access the V-Safe system.   Timothy David was instructed to call 911 with any severe reactions post vaccine: Marland Kitchen Difficulty breathing  . Swelling of face and throat  . A fast heartbeat  . A bad rash all over body  . Dizziness and weakness

## 2020-04-13 ENCOUNTER — Other Ambulatory Visit (HOSPITAL_COMMUNITY): Payer: Self-pay | Admitting: Internal Medicine

## 2020-07-03 ENCOUNTER — Other Ambulatory Visit: Payer: PRIVATE HEALTH INSURANCE

## 2020-07-03 ENCOUNTER — Other Ambulatory Visit: Payer: Self-pay

## 2020-07-03 DIAGNOSIS — Z20822 Contact with and (suspected) exposure to covid-19: Secondary | ICD-10-CM

## 2020-07-05 LAB — NOVEL CORONAVIRUS, NAA: SARS-CoV-2, NAA: NOT DETECTED

## 2020-07-05 LAB — SARS-COV-2, NAA 2 DAY TAT

## 2020-10-12 ENCOUNTER — Other Ambulatory Visit (HOSPITAL_COMMUNITY): Payer: Self-pay | Admitting: *Deleted

## 2020-10-16 ENCOUNTER — Ambulatory Visit (HOSPITAL_COMMUNITY)
Admission: RE | Admit: 2020-10-16 | Discharge: 2020-10-16 | Disposition: A | Discharge status: Home / Self Care | Payer: Self-pay | Source: Ambulatory Visit | Attending: Cardiology | Admitting: Cardiology

## 2020-10-16 ENCOUNTER — Other Ambulatory Visit: Payer: Self-pay

## 2020-12-05 ENCOUNTER — Other Ambulatory Visit: Payer: Self-pay

## 2020-12-05 ENCOUNTER — Ambulatory Visit: Payer: PRIVATE HEALTH INSURANCE | Attending: Internal Medicine

## 2020-12-05 DIAGNOSIS — Z23 Encounter for immunization: Secondary | ICD-10-CM

## 2020-12-05 NOTE — Progress Notes (Signed)
   Covid-19 Vaccination Clinic  Name:  Timothy David    MRN: [id number] DOB: 1956/01/21  12/05/2020  Timothy David was observed post Covid-19 immunization for 15 minutes without incident. He was provided with Vaccine Information Sheet and instruction to access the V-Safe system.   Timothy David was instructed to call 911 with any severe reactions post vaccine: Marland Kitchen Difficulty breathing  . Swelling of face and throat  . A fast heartbeat  . A bad rash all over body  . Dizziness and weakness   Immunizations Administered    Name Date Dose VIS Date Route   PFIZER Comrnaty(Gray TOP) Covid-19 Vaccine 12/05/2020  3:41 PM 0.3 mL 06/21/2020 Intramuscular   Manufacturer: [employer], [employer]   Lot: [id number]   NDC: [phone]

## 2021-06-23 IMAGING — CT CT CARDIAC CORONARY ARTERY CALCIUM SCORE
2 series · 16 of 20 positions shown, 18 images · non-contrast
Comparison: None.
COMPARISON: None.

Addendum:
EXAM:
OVER-READ INTERPRETATION  CT CHEST

The following report is an over-read performed by radiologist Dr.
M Hamza Tiger [REDACTED] on 10/16/2020. This
over-read does not include interpretation of cardiac or coronary
anatomy or pathology. The coronary calcium score/coronary CTA
interpretation by the cardiologist is attached.
CLINICAL DATA: Risk stratification
Coronary Calcium Score
TECHNIQUE: The patient was scanned on a Siemens Somatom 64 slice scanner. Axial
non-contrast 3 mm slices were carried out through the heart. The
data set was analyzed on a dedicated work station and scored using
the Agatson method.

[Series 3: cascseq 2.0 b35f 70% · axial · 0.42mm/px · z∈[+1139,+1271]mm · 8 of 86 slices shown]
[im 10/86  vessel]
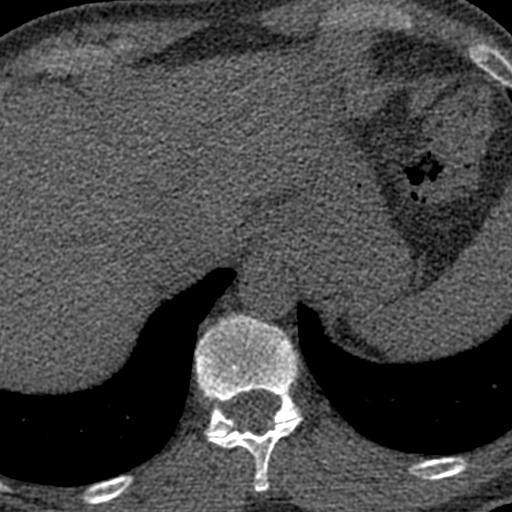
[im 19/86  vessel]
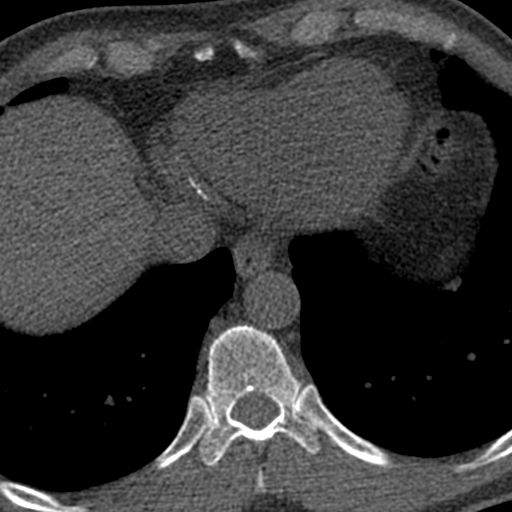
[im 29/86  vessel]
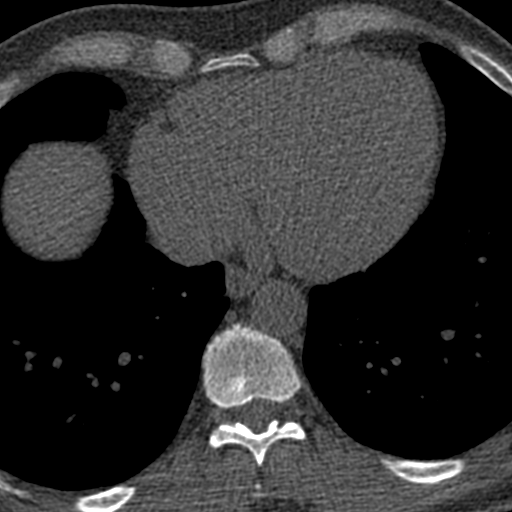
[im 38/86  vessel]
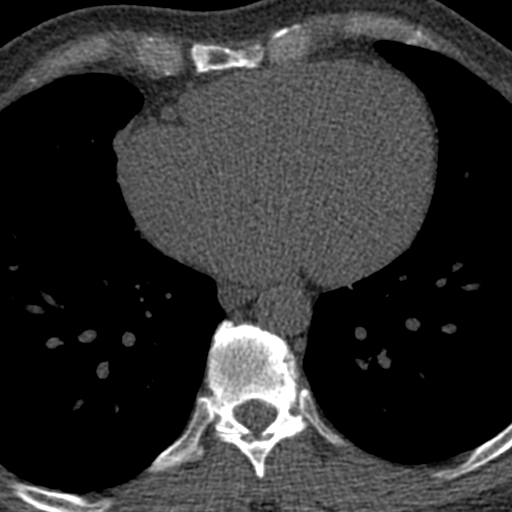
[im 48/86  vessel]
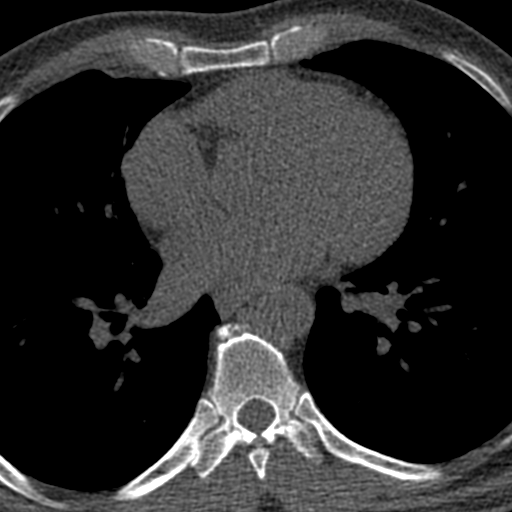
[im 57/86  vessel]
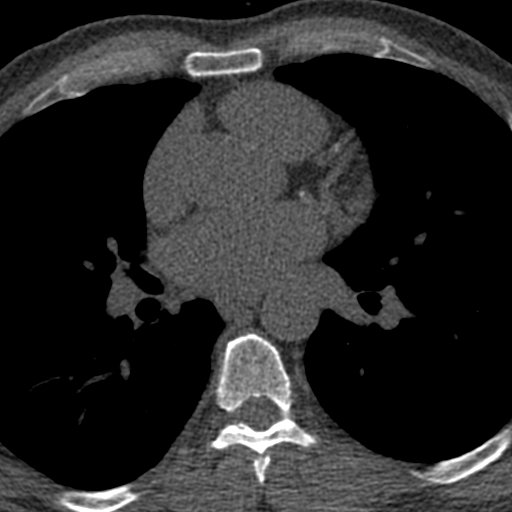
[im 67/86  vessel]
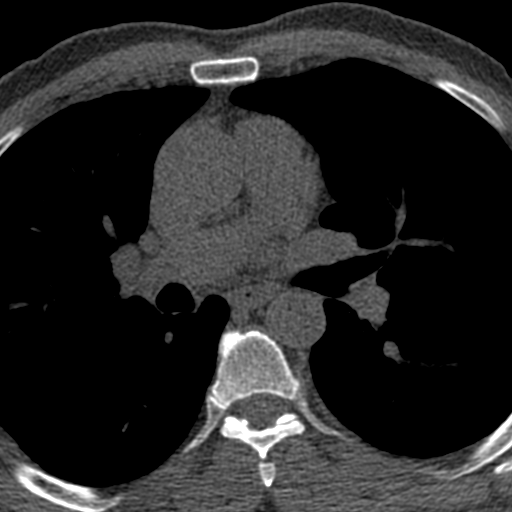
[im 76/86  vessel]
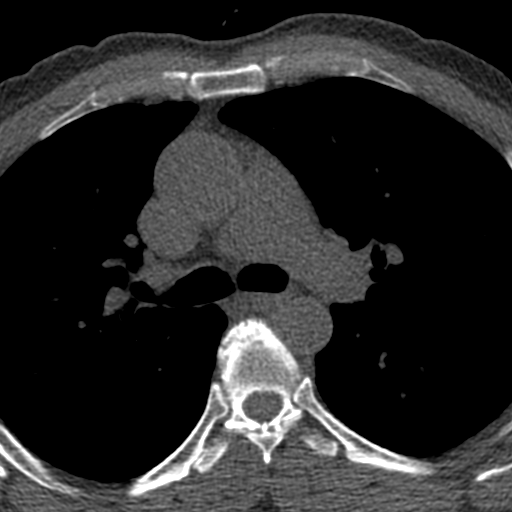

[Series 4: ax st full fov · axial · 0.88mm/px · z∈[+1139,+1271]mm · 8 of 86 slices shown, 10 images]
[im 10/86  vessel]
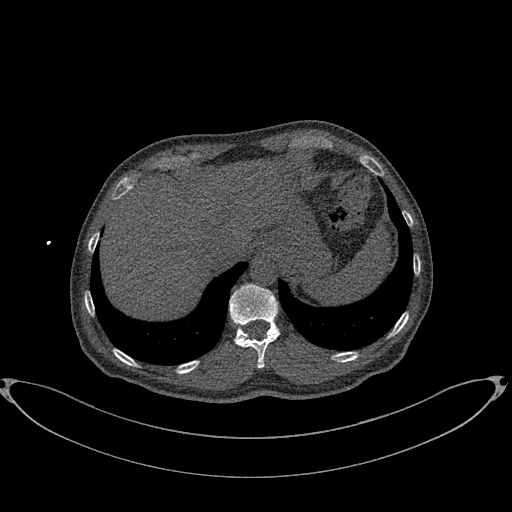
[im 10/86  lung]
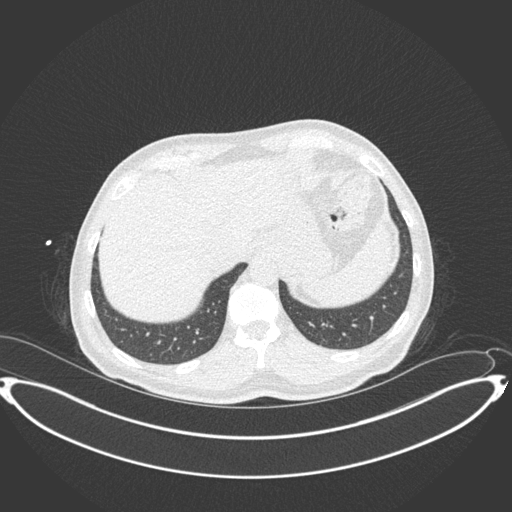
[im 19/86  vessel]
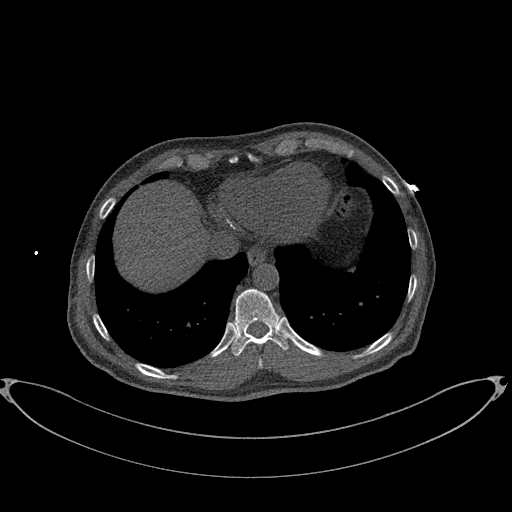
[im 29/86  vessel]
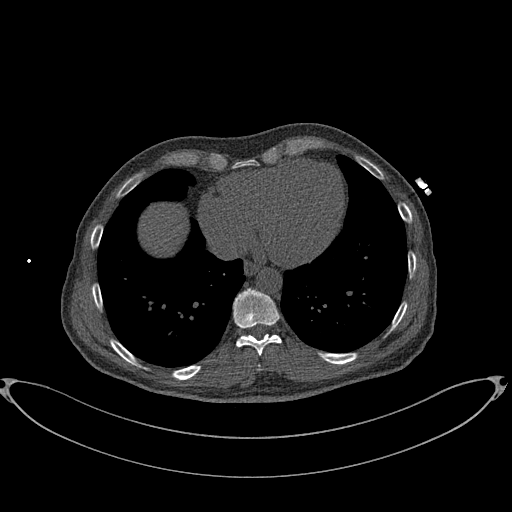
[im 38/86  vessel]
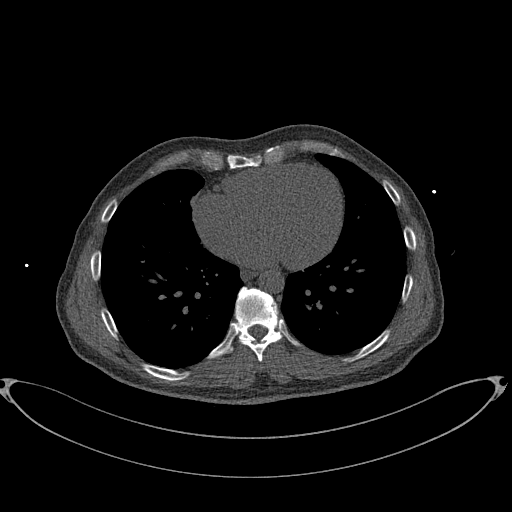
[im 48/86  vessel]
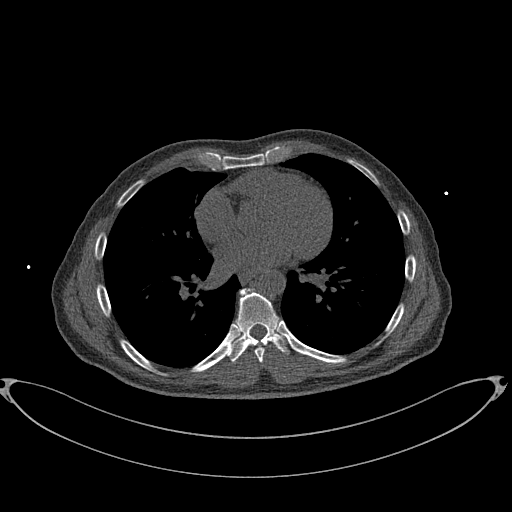
[im 48/86  lung]
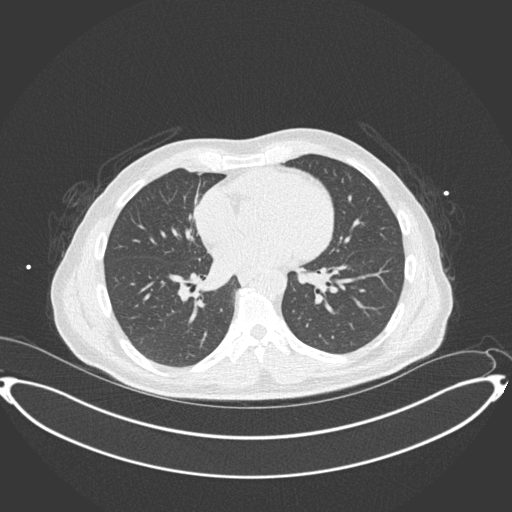
[im 57/86  vessel]
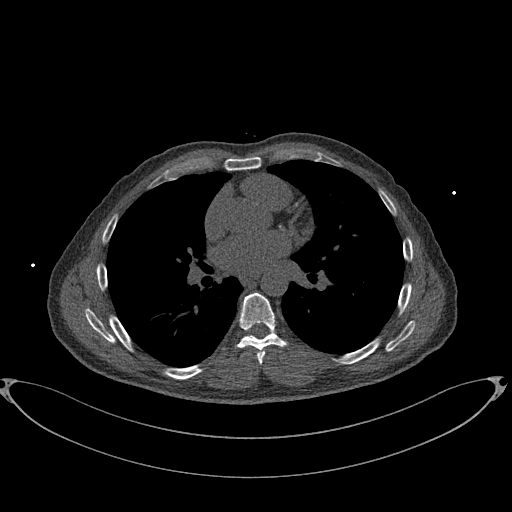
[im 67/86  vessel]
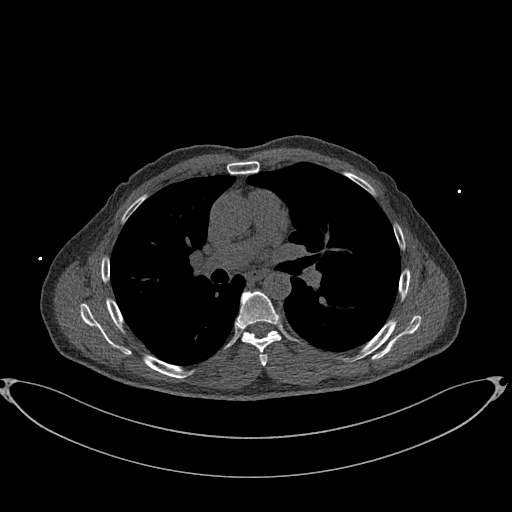
[im 76/86  vessel]
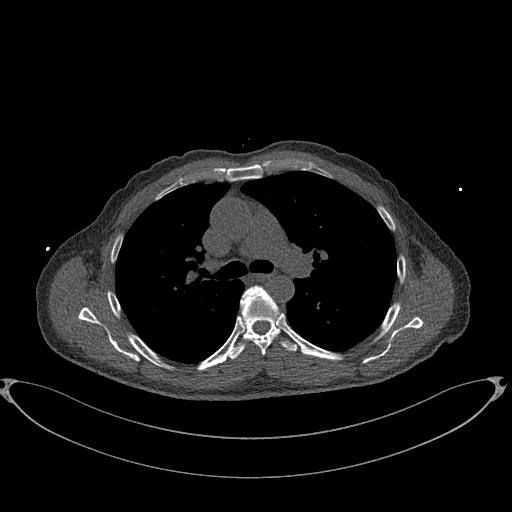

[16 of 20 positions shown; findings below may reference images not displayed]

FINDINGS: Limited view of the lung parenchyma demonstrates no suspicious
nodularity. Airways are normal.

Limited view of the mediastinum demonstrates no adenopathy.
Esophagus normal.

Limited view of the upper abdomen unremarkable.

Limited view of the skeleton and chest wall is unremarkable.
IMPRESSION: No significant extracardiac findings.
FINDINGS: Non-cardiac: See separate report from [REDACTED].

Ascending aorta: Normal diameter 3.6 cm

Pericardium: Normal

Coronary arteries: Calcium noted in all 3 major epicardial coronary
arteries

LAD:  154

Circumflex: 3

RCA 121

Total Score:  278
IMPRESSION: Coronary calcium score of 278. This was 73 [REDACTED] percentile for age and
sex matched control.

Ricky Calles

*** End of Addendum ***
EXAM:
OVER-READ INTERPRETATION  CT CHEST

The following report is an over-read performed by radiologist Dr.
M Hamza Tiger [REDACTED] on 10/16/2020. This
over-read does not include interpretation of cardiac or coronary
anatomy or pathology. The coronary calcium score/coronary CTA
interpretation by the cardiologist is attached.
FINDINGS: Limited view of the lung parenchyma demonstrates no suspicious
nodularity. Airways are normal.

Limited view of the mediastinum demonstrates no adenopathy.
Esophagus normal.

Limited view of the upper abdomen unremarkable.

Limited view of the skeleton and chest wall is unremarkable.
IMPRESSION: No significant extracardiac findings.

## 2021-12-03 ENCOUNTER — Ambulatory Visit (INDEPENDENT_AMBULATORY_CARE_PROVIDER_SITE_OTHER): Payer: PRIVATE HEALTH INSURANCE | Admitting: Physician Assistant

## 2021-12-03 DIAGNOSIS — Z85828 Personal history of other malignant neoplasm of skin: Secondary | ICD-10-CM | POA: Diagnosis not present

## 2021-12-03 DIAGNOSIS — Z1283 Encounter for screening for malignant neoplasm of skin: Secondary | ICD-10-CM

## 2021-12-03 DIAGNOSIS — L570 Actinic keratosis: Secondary | ICD-10-CM

## 2021-12-03 DIAGNOSIS — Z86018 Personal history of other benign neoplasm: Secondary | ICD-10-CM

## 2021-12-03 MED ORDER — FLUOROURACIL 5 % EX CREA: TOPICAL_CREAM | Freq: Two times a day (BID) | CUTANEOUS | 1 refills | Status: AC

## 2021-12-03 NOTE — Patient Instructions (Signed)
Use medication every night x 2 weeks

## 2021-12-11 ENCOUNTER — Encounter: Payer: Self-pay | Admitting: Physician Assistant

## 2021-12-11 NOTE — Progress Notes (Signed)
   New Patient   Subjective  Timothy David is a 66 y.o. male who presents for the following: Annual Exam (Here for annual skin exam. Concerns check scalp. Has had issue in the past. History of non mole skin cancer and atypical moles. ).   The following portions of the chart were reviewed this encounter and updated as appropriate:  Tobacco  Allergies  Meds  Problems  Med Hx  Surg Hx  Fam Hx      Objective  Well appearing patient in no apparent distress; mood and affect are within normal limits.  All skin waist up examined.  No atypical nevi No signs of non-mole skin cancer.   Head - Anterior (Face), scalp (7) Erythematous patches with gritty scale.   Assessment & Plan  AK (actinic keratosis) (8) Head - Anterior (Face); scalp (7)  Destruction of lesion - Head - Anterior (Face), scalp Complexity: simple   Destruction method: cryotherapy   Informed consent: discussed and consent obtained   Timeout:  patient name, date of birth, surgical site, and procedure verified Lesion destroyed using liquid nitrogen: Yes   Cryotherapy cycles:  1 Outcome: patient tolerated procedure well with no complications   Post-procedure details: wound care instructions given    fluorouracil (EFUDEX) 5 % cream - Head - Anterior (Face), scalp Apply topically 2 (two) times daily.  Screening for malignant neoplasm of skin  Yearly skin exam.     I, Sharlyn Odonnel, PA-C, have reviewed all documentation's for this visit.  The documentation on 12/11/21 for the exam, diagnosis, procedures and orders are all accurate and complete.

## 2022-03-03 DIAGNOSIS — Z125 Encounter for screening for malignant neoplasm of prostate: Secondary | ICD-10-CM | POA: Diagnosis not present

## 2022-03-03 DIAGNOSIS — E785 Hyperlipidemia, unspecified: Secondary | ICD-10-CM | POA: Diagnosis not present

## 2022-03-03 DIAGNOSIS — R5383 Other fatigue: Secondary | ICD-10-CM | POA: Diagnosis not present

## 2022-03-03 DIAGNOSIS — R7989 Other specified abnormal findings of blood chemistry: Secondary | ICD-10-CM | POA: Diagnosis not present

## 2022-03-10 DIAGNOSIS — H04123 Dry eye syndrome of bilateral lacrimal glands: Secondary | ICD-10-CM | POA: Diagnosis not present

## 2022-03-10 DIAGNOSIS — Z0000 Encounter for general adult medical examination without abnormal findings: Secondary | ICD-10-CM | POA: Diagnosis not present

## 2022-03-10 DIAGNOSIS — M47812 Spondylosis without myelopathy or radiculopathy, cervical region: Secondary | ICD-10-CM | POA: Diagnosis not present

## 2022-03-10 DIAGNOSIS — R82998 Other abnormal findings in urine: Secondary | ICD-10-CM | POA: Diagnosis not present

## 2022-03-10 DIAGNOSIS — E785 Hyperlipidemia, unspecified: Secondary | ICD-10-CM | POA: Diagnosis not present

## 2022-03-10 DIAGNOSIS — R3911 Hesitancy of micturition: Secondary | ICD-10-CM | POA: Diagnosis not present

## 2022-07-31 ENCOUNTER — Other Ambulatory Visit (HOSPITAL_COMMUNITY): Payer: Self-pay

## 2022-09-04 ENCOUNTER — Ambulatory Visit: Payer: PRIVATE HEALTH INSURANCE | Admitting: Sports Medicine

## 2022-09-04 VITALS — BP 126/78 | Ht 68.5 in | Wt 170.0 lb

## 2022-09-04 DIAGNOSIS — M533 Sacrococcygeal disorders, not elsewhere classified: Secondary | ICD-10-CM

## 2022-09-04 DIAGNOSIS — G8929 Other chronic pain: Secondary | ICD-10-CM | POA: Diagnosis not present

## 2022-09-04 NOTE — Progress Notes (Signed)
PCP: Donnajean Lopes, MD  Subjective:   HPI: Patient is a 67 y.o. male here for L SI vs L hip pain.  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  Cycles 8-10 hrs/week. L hip pain and SI pain for past 3 months. SI pain constant, worse with standing and bending and going up hills when cycling, this is affecting his cycling. Will ice hip and SI joint after cycling and stretch. L hip and SI joint are both sore after riding. Has taken tylenol or motrin on occasion.   He is doing a consistent exercise work out with bands.   BP 126/78   Ht 5' 8.5" (1.74 m)   Wt 170 lb (77.1 kg)   BMI 25.47 kg/m       Objective:  Physical Exam:  Gen: awake, alert, NAD, comfortable in exam room Pulm: breathing unlabored Hip:  - Inspection: No gross deformity, no swelling, erythema, or ecchymosis - Palpation: No TTP, specifically none over greater trochanter - ROM: Normal range of motion on Flexion, extension, abduction, internal and external rotation of R hip, slightly limited with internal and external rotation of L hip - Strength: 5/5 strength bilaterally. - Neuro/vasc: NV intact distally - SI joint more mobile on the L    Assessment & Plan:  1.  L SI joint pain Likely related to the increase in cycling over the last few months.  On exam he is slightly limited with internal and external rotation, not enough to indicate OA but enough to indicate tight muscles/ligaments surrounding hip which is within causing laxity of the SI joint to compensate and therefore causing SI pain.  He was provided with a series of L hip abductor strengthening home exercises to complete daily and once pain improves can space them out to a few times a week.  Precious Gilding, DO Family medicine resident, PGY-2  I observed and examined the patient with the resident and agree with assessment and plan.  Note reviewed and modified by me. Ila Mcgill, MD

## 2022-10-06 ENCOUNTER — Ambulatory Visit (INDEPENDENT_AMBULATORY_CARE_PROVIDER_SITE_OTHER): Payer: PPO | Admitting: Podiatry

## 2022-10-06 DIAGNOSIS — B351 Tinea unguium: Secondary | ICD-10-CM | POA: Diagnosis not present

## 2022-10-06 MED ORDER — TERBINAFINE HCL 250 MG PO TABS: 250.0000 mg | ORAL_TABLET | Freq: Every day | ORAL | 0 refills | Status: AC

## 2022-10-06 NOTE — Progress Notes (Signed)
   Subjective: 67 y.o. male presenting today as a new patient for evaluation of thickening with discoloration to the bilateral great toes.  Patient is an avid road cycler and over the last year he has noticed thickening with discoloration.  He also has noticed a small split in the left hallux nail plate.  Presenting today for evaluation.  Concern for possible toenail fungus  Past Medical History:  Diagnosis Date   Asthma    exercise - induced   Dyslipidemia    ED (erectile dysfunction)    Hearing loss, bilateral    Hypertension, benign    Tibialis posterior tendinitis 12/21/2013   Documented on [place] Tx with NTG     Past Surgical History:  Procedure Laterality Date   FINGER SURGERY     left index finger dislocation   KNEE ARTHROSCOPY W/ ACL RECONSTRUCTION  1990   SPHINCTEROTOMY     perirectal abscess incision and drainage    No Known Allergies  Objective: Physical Exam General: The patient is alert and oriented x3 in no acute distress.  Dermatology: Hyperkeratotic, discolored, thickened, onychodystrophy noted bilateral great toes. Skin is warm, dry and supple bilateral lower extremities. Negative for open lesions or macerations.  Vascular: Palpable pedal pulses bilaterally. No edema or erythema noted. Capillary refill within normal limits.  Neurological: Epicritic and protective threshold grossly intact bilaterally.   Musculoskeletal Exam: No pedal deformity noted  Assessment: #1 Onychomycosis of toenails bilateral great toes  Plan of Care:  #1 Patient was evaluated. #2  Today we discussed different treatment options including oral, topical, and laser antifungal treatment modalities.  We discussed their efficacies and side effects.  Patient opts for oral antifungal treatment modality #3 prescription for Lamisil 250 mg #90 daily. Pt denies a history of liver pathology or symptoms.  Patient is otherwise healthy #4  OTC topical Tolcylen antifungal dispensed at checkout.  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  Going to [place] to cycle this summer   Edrick Kins, DPM [facility]  Dr. Edrick Kins, DPM    [address], [place] [address]                Office [phone]  Fax [phone]

## 2023-02-12 DIAGNOSIS — G8929 Other chronic pain: Secondary | ICD-10-CM | POA: Diagnosis not present

## 2023-02-12 DIAGNOSIS — E785 Hyperlipidemia, unspecified: Secondary | ICD-10-CM | POA: Diagnosis not present

## 2023-02-12 DIAGNOSIS — N529 Male erectile dysfunction, unspecified: Secondary | ICD-10-CM | POA: Diagnosis not present

## 2023-02-12 DIAGNOSIS — Z87891 Personal history of nicotine dependence: Secondary | ICD-10-CM | POA: Diagnosis not present

## 2023-02-12 DIAGNOSIS — N400 Enlarged prostate without lower urinary tract symptoms: Secondary | ICD-10-CM | POA: Diagnosis not present

## 2023-02-12 DIAGNOSIS — H259 Unspecified age-related cataract: Secondary | ICD-10-CM | POA: Diagnosis not present

## 2023-04-06 DIAGNOSIS — Z125 Encounter for screening for malignant neoplasm of prostate: Secondary | ICD-10-CM | POA: Diagnosis not present

## 2023-04-06 DIAGNOSIS — R351 Nocturia: Secondary | ICD-10-CM | POA: Diagnosis not present

## 2023-04-06 DIAGNOSIS — N5201 Erectile dysfunction due to arterial insufficiency: Secondary | ICD-10-CM | POA: Diagnosis not present

## 2023-05-04 ENCOUNTER — Ambulatory Visit (INDEPENDENT_AMBULATORY_CARE_PROVIDER_SITE_OTHER): Payer: PPO | Admitting: Behavioral Health

## 2023-05-04 DIAGNOSIS — F4329 Adjustment disorder with other symptoms: Secondary | ICD-10-CM | POA: Diagnosis not present

## 2023-05-04 NOTE — Progress Notes (Signed)
                Anastasya Jewell L Farryn Linares, LMFT 

## 2023-05-04 NOTE — Progress Notes (Signed)
[facility] Counselor Initial Adult Exam  Name: Timothy David Date: 05/04/2023 MRN: [id number] DOB: February 25, 1956 PCP: Timothy Fillers, MD  Time spent: 60 min In Person @ [facility] Office w/[name]. Time In: 2:00pm Time Out: 3:00pm  Guardian/Payee:  [employer]    Paperwork requested: No   Reason for Visit /Presenting Problem: [name] is f/u on a suggestion provided by Wife's Psychotherapist Timothy David  Mental Status Exam: Appearance:   Casual     Behavior:  Appropriate and Sharing  Motor:  Normal  Speech/Language:   Clear and Coherent  Affect:  Appropriate  Mood:  normal  Thought process:  normal  Thought content:    WNL  Sensory/Perceptual disturbances:    WNL  Orientation:  oriented to person, place, time/date, and situation  Attention:  Good  Concentration:  Good  Memory:  WNL  Fund of knowledge:   Good  Insight:    Good  Judgment:   Good  Impulse Control:  Good    Risk Assessment: Danger to Self:  No Self-injurious Behavior: No Danger to Others: No Duty to Warn:no Physical Aggression / Violence:No  Access to Firearms a concern: No  Gang Involvement:No  Patient / guardian was educated about steps to take if suicide or homicide risk level increases between visits: yes; appropriate to ICD process While future psychiatric events cannot be accurately predicted, the patient does not currently require acute inpatient psychiatric care and does not currently meet [facility] involuntary commitment criteria.  Substance Abuse History: Current substance abuse: No     Past Psychiatric History:   No previous psychological problems have been observed Outpatient Providers: Timothy Quale, MD History of Psych Hospitalization: No  Psychological Testing:  NA    Abuse History:  Victim of: No.,  NA    Report needed: No. Victim of Neglect:No. Perpetrator of  NA   Witness / Exposure to Domestic Violence: No   Protective Services  Involvement: No  Witness to [employer] Violence:  No   Family History:  Family History  Problem Relation Age of Onset   Cancer Mother        lung   Heart disease Father    Hypertension Father    Cancer Sister        breast   Other Sister        Takotsubo cardiomyopathy   Healthy Brother    Healthy Sister    Healthy Sister    Healthy Sister    Healthy Sister     Living situation: the patient lives with their family  Sexual Orientation: Straight  Relationship Status: married  Name of spouse / other: [name] If a parent, number of children / ages:[name] has 3 children tgthr; 17yo Sr @ [employer] named Timothy David, 21yo Son Timothy David who is a Sr @ [employer], & 23yo Son Timothy David who lives @ home & is completing his Master's Degree @ Designer, fashion/clothing in [employer].  Pt has 2 children w/his First Wife; 36yo Timothy David & 38yo Timothy David.  Timothy David & Timothy David are both, "needy". They ea have a Dx of ADHD & anxiety. Both have poor social skills. Timothy David is self-sufficient, has good Pharmacist, community, & recent issues in relationship w/Gfriends.   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 [name] works for a [employer] doing billing & coding. [name] wants to ensure Ins for the Family.  Military Service: No   Educational History: Education: post Engineer, maintenance (IT) work or degree for both Spouses in the Healthcare field  Religion/Sprituality/World View: Unk  Any cultural differences that may affect / interfere with treatment:  None noted today  Recreation/Hobbies: Both Spouses enjoy sports & music, time out tgthr  Stressors: Marital or family conflict  [name] explains they wish to improve communication & strengthen their bond. They want to know ea other deeper, be less guarded w/ea other, have less tension in the home & for Pt be more avail to the Children. They want to stop some patterns of communicating that do not  serve them well & allow the children to enjoy them as a Parenting Unit on the same page.   Strengths: Supportive Relationships, Family, Friends, Hopefulness, Journalist, newspaper, and Able to Communicate Effectively when they put in the effort  Barriers:  None noted today   Legal History: Pending legal issue / charges: The patient has no significant history of legal issues. History of legal issue / charges:  NA  Medical History/Surgical History: reviewed Past Medical History:  Diagnosis Date   Asthma    exercise - induced   Dyslipidemia    ED (erectile dysfunction)    Hearing loss, bilateral    Hypertension, benign    Tibialis posterior tendinitis 12/21/2013   Documented on [place] Tx with NTG     Past Surgical History:  Procedure Laterality Date   FINGER SURGERY     left index finger dislocation   KNEE ARTHROSCOPY W/ ACL RECONSTRUCTION  1990   SPHINCTEROTOMY     perirectal abscess incision and drainage    Medications: Current Outpatient Medications  Medication Sig Dispense Refill   aspirin EC 81 MG tablet Take by mouth.     ezetimibe (ZETIA) 10 MG tablet Take 1 tablet by mouth daily.     finasteride (PROSCAR) 5 MG tablet Take 5 mg by mouth daily.     finasteride (PROSCAR) 5 MG tablet Take 1 tablet by mouth daily.     fluorouracil (EFUDEX) 5 % cream Apply topically 2 (two) times daily. 40 g 1   pravastatin (PRAVACHOL) 10 MG tablet Take 10 mg by mouth daily.     rosuvastatin (CRESTOR) 20 MG tablet Take 20 mg by mouth at bedtime.     SILDENAFIL CITRATE PO Take 20 mg by mouth. TAKE 1-5 TABS BY MOUTH DAILY PRN     tadalafil (CIALIS) 5 MG tablet Take by mouth.     tamsulosin (FLOMAX) 0.4 MG CAPS capsule Take 0.4 mg by mouth.     terbinafine (LAMISIL) 250 MG tablet Take 1 tablet (250 mg total) by mouth daily. 90 tablet 0   zaleplon (SONATA) 10 MG capsule Take 10 mg by mouth at bedtime as needed for sleep.     No current facility-administered medications for this visit.    No Known  Allergies  Diagnoses:  Adjustment disorder with other symptom  Plan of Care: [name] & Timothy David will attend all sessions as scheduled every 2-3 wks. They will complete suggestions provided & we will discuss their progress next visit. This first assignment is to find time to go out & have fun tgthr. They need a night out just to enjoy themselves alone.   Target Date: 06/13/2023  Progress: 3  Frequency: Once every 2-3 wks  Modality: [name] Th   [name], LMFT

## 2023-05-18 DIAGNOSIS — E785 Hyperlipidemia, unspecified: Secondary | ICD-10-CM | POA: Diagnosis not present

## 2023-05-18 DIAGNOSIS — Z0000 Encounter for general adult medical examination without abnormal findings: Secondary | ICD-10-CM | POA: Diagnosis not present

## 2023-05-18 DIAGNOSIS — Z0189 Encounter for other specified special examinations: Secondary | ICD-10-CM | POA: Diagnosis not present

## 2023-05-18 DIAGNOSIS — Z1212 Encounter for screening for malignant neoplasm of rectum: Secondary | ICD-10-CM | POA: Diagnosis not present

## 2023-05-25 DIAGNOSIS — M542 Cervicalgia: Secondary | ICD-10-CM | POA: Diagnosis not present

## 2023-05-25 DIAGNOSIS — H04123 Dry eye syndrome of bilateral lacrimal glands: Secondary | ICD-10-CM | POA: Diagnosis not present

## 2023-05-25 DIAGNOSIS — Z0000 Encounter for general adult medical examination without abnormal findings: Secondary | ICD-10-CM | POA: Diagnosis not present

## 2023-05-25 DIAGNOSIS — M47812 Spondylosis without myelopathy or radiculopathy, cervical region: Secondary | ICD-10-CM | POA: Diagnosis not present

## 2023-05-25 DIAGNOSIS — G4700 Insomnia, unspecified: Secondary | ICD-10-CM | POA: Diagnosis not present

## 2023-05-25 DIAGNOSIS — E785 Hyperlipidemia, unspecified: Secondary | ICD-10-CM | POA: Diagnosis not present

## 2023-05-25 DIAGNOSIS — R82998 Other abnormal findings in urine: Secondary | ICD-10-CM | POA: Diagnosis not present

## 2023-05-27 ENCOUNTER — Ambulatory Visit: Payer: Self-pay | Admitting: Behavioral Health

## 2023-05-28 ENCOUNTER — Ambulatory Visit: Payer: PPO | Admitting: Behavioral Health

## 2023-05-28 DIAGNOSIS — F4329 Adjustment disorder with other symptoms: Secondary | ICD-10-CM

## 2023-05-28 NOTE — Progress Notes (Signed)
                Timothy Jewell L Farryn Linares, LMFT 

## 2023-05-28 NOTE — Progress Notes (Addendum)
[facility] Counselor/Therapist Progress Note  Patient ID: Timothy David, MRN: [id number],    Date: 05/28/2023  Time Spent: 55 min In Person @ [facility] Time In: 11:00am Time Out: 11:55am   Treatment Type:  [name] Th  Reported Symptoms: Wife reports today she is tired of the lack of emotional connection in the marriage. She has dealt w/this their whole relationship & she wants this to change so they can bond more & communicate better. [name] is also examining these needs.  Mental Status Exam: Appearance:  Casual     Behavior: Appropriate and Sharing  Motor: Normal  Speech/Language:  Clear and Coherent  Affect: Appropriate  Mood: normal  Thought process: normal  Thought content:   WNL  Sensory/Perceptual disturbances:   WNL  Orientation: oriented to person, place, time/date, and situation  Attention: Good  Concentration: Good  Memory: WNL  Fund of knowledge:  Good  Insight:   Good  Judgment:  Good  Impulse Control: Good   Risk Assessment: Danger to Self:  No Self-injurious Behavior: No Danger to Others: No Duty to Warn:no Physical Aggression / Violence:No  Access to Firearms a concern: No  Gang Involvement:No   Subjective: [name] made time to go out to eat last Friday evening. They enjoyed ea other & had fun tgthr. [name] has been trying to assist his 68yo Dtr Timothy David w/her Timothy David assignments-this has been challenging. Dtr has Dx of ADHD, OCD, & anxiety. She is working @ [employer]. This job is good for her bc interactions w/ppl are minimal.   Interventions: Family Systems and [name] Th using Gottman & EFT basics  Diagnosis:Adjustment disorder with other symptom  Plan: Timothy David & Timothy David will secure the book, 'Emotional Intelligence' by Timothy David. They will begin reading this & note takeaways for our sessions. We will begin to fllw the principles of EFT which is a Therapeutic Modality founded by Timothy Sia, PhD. The elements of this type of therapy  will hopefully bring the [name] closer emot'ly in the near future.  Target Date: 12/15/224  Progress: 5  Frequency: Once every 2-3 wks  Modality: [name] Th  Timothy Lever, LMFT

## 2023-06-16 DIAGNOSIS — G4710 Hypersomnia, unspecified: Secondary | ICD-10-CM | POA: Diagnosis not present

## 2023-06-17 DIAGNOSIS — G4710 Hypersomnia, unspecified: Secondary | ICD-10-CM | POA: Diagnosis not present

## 2023-06-19 ENCOUNTER — Ambulatory Visit (INDEPENDENT_AMBULATORY_CARE_PROVIDER_SITE_OTHER): Payer: PPO | Admitting: Behavioral Health

## 2023-06-19 DIAGNOSIS — F4329 Adjustment disorder with other symptoms: Secondary | ICD-10-CM

## 2023-06-19 NOTE — Progress Notes (Signed)
                Anastasya Jewell L Farryn Linares, LMFT 

## 2023-06-19 NOTE — Progress Notes (Signed)
[facility] Counselor/Therapist Progress Note  Patient ID: Timothy David, MRN: [id number],    Date: 06/30/2023  Time Spent: 55 min Caregility video; [name] is home in private & Provider is working remotely from [employer]. [name] is aware of the risks/limitations of telehealth & consents to Tx today.  Time In: 12:00pm Time Out: 12:55pm   Treatment Type:  [name] Th  Reported Symptoms: Elevated anx & stressful worry over the Holiday Season since 2 children have life concerns.  Mental Status Exam: Appearance:  Casual     Behavior: Appropriate, Sharing, and tears on the part of Timothy David  Motor: Normal  Speech/Language:  Clear and Coherent and Normal Rate  Affect: Appropriate and emot'l responses by Timothy David  Mood: normal  Thought process: normal  Thought content:   WNL  Sensory/Perceptual disturbances:   WNL  Orientation: oriented to person, place, time/date, and situation  Attention: Good  Concentration: Good  Memory: WNL  Fund of knowledge:  Good  Insight:   Good  Judgment:  Good  Impulse Control: Good   Risk Assessment: Danger to Self:  No Self-injurious Behavior: No Danger to Others: No Duty to Warn:no Physical Aggression / Violence:No  Access to Firearms a concern: No  Gang Involvement:No   Subjective: [name] are discussing the issues of their 67yo Dtr Timothy David who rec'd a speeding ticket recently. She will need to help pay for the ticket & the rise in Ins costs as she is working & able. Son Timothy David has recently broken up w/his GF & has spoken w/Mom several times since his upset about it. Timothy David expresses how they are closer now, since he has wanted to speak about it w/her.   [name] agree they do not model the best example of relational skills & want to improve this. Timothy David related she never saw affection btwn her Parents & they "never argued". Timothy David described Parents who were 79yrs apart in age, & an older Dad who was stoic. They were divorced when he was 67yo. Timothy David "welcomes more  affection"!  Discussed the idea of a "good argument". Discussed Teamwork approach to Parenting since Timothy David has held most of this responsibility for 18 yrs. Timothy David finds herself resentful lately about how they have divided Parenting duties.  Interventions: Family Systems and [name] Th using Gottman & elements of EFT  Diagnosis:Adjustment disorder with other symptom  Plan: [name] will function in greater Teamwork fashion. Timothy David will ck-in w/Son Timothy David about the break-up & express concern & a unifying bond w/Timothy David. They will let the children witness the changes they are implementing & listen for comments. They will show greater engagement w/ea other in front of the children, esp'ly over the Holidays.   Timothy David & Timothy David will reduce phone time & inc f24f time. They will estb a code word that can cue Timothy David about Timothy David's beginnings of resentment so he can inquire w/her about it to prevent & keep it to a minimum. When Timothy David gets resentful she tends to get quiet & distance from Timothy David. He will attend to this more. They will use the [facility] Exercise we conducted in last session to promote more affection. This brought up many deep emotions for Timothy David & she is examining how/why this happened.   Target Date: 07/29/2023  Progress: 6  Frequency: Once every 2-3 wks  Modality: [name] Th  Timothy Lever, LMFT

## 2023-06-29 ENCOUNTER — Ambulatory Visit: Payer: PPO | Admitting: Behavioral Health

## 2023-06-29 DIAGNOSIS — F4329 Adjustment disorder with other symptoms: Secondary | ICD-10-CM

## 2023-06-29 NOTE — Progress Notes (Signed)
[facility] Counselor/Therapist Progress Note  Patient ID: Timothy David, MRN: [id number],    Date: 06/29/2023  Time Spent: 55 min In person @ [facility] Office Time In: 9:00am Time Out: 9:55am   Treatment Type:  [name] Th  Reported Symptoms: Elevated anx/dep & stress due to Family dynamics w/3 grown children & the Holiday Season.  Mental Status Exam: Appearance:  Casual and Neat     Behavior: Appropriate and Sharing  Motor: Normal  Speech/Language:  Clear and Coherent  Affect: Appropriate  Mood: normal  Thought process: normal  Thought content:   WNL  Sensory/Perceptual disturbances:   WNL  Orientation: oriented to person, place, time/date, and situation  Attention: Good  Concentration: Good  Memory: WNL  Fund of knowledge:  Good  Insight:   Good  Judgment:  Good  Impulse Control: Good   Risk Assessment: Danger to Self:  No Self-injurious Behavior: No Danger to Others: No Duty to Warn:no Physical Aggression / Violence:No  Access to Firearms a concern: No  Gang Involvement:No   Subjective: Pt & Wife are in good spirits today discussing the recent wknd they travelled to [place] for a Wedding. It was like a reunion as they saw many people they knew from raising children tgthr as backyard Families. They really enjoyed this time tgthr while the kids were home.   Dtr needs a Designer, fashion/clothing to do her 26 hrs prior to a Feb Courtdate, Son [name] is keeping himself busy & healthy since his breakup & [name] may get a great job opp through a Family connection in [place].     Interventions: Family Systems and [name] Th using [name], LMFT, CGT & Attachment Theory . Completed the 'Building a Secure Relationship' assessment. Goals now:   1-Emot'l attunement  2-Responsiveness  3-Unwavering commitment  Diagnosis:Adjustment disorder with other symptom  Plan: Timothy David & Timothy David will both explore the Attachment Prog website & complete the Assessment w/the  pdf if they can purchase it. They have secured a copy of Karle Starch, 'Emot'l Intelligence' & will read this @ their leisure for now.  Target Date: 07/14/2023  Progress: 7  Frequency: Once every 2-3 wks Modality: [name] Th  [name], LMFT

## 2023-06-29 NOTE — Progress Notes (Signed)
                Anastasya Jewell L Farryn Linares, LMFT 

## 2023-07-02 DIAGNOSIS — L821 Other seborrheic keratosis: Secondary | ICD-10-CM | POA: Diagnosis not present

## 2023-07-02 DIAGNOSIS — B353 Tinea pedis: Secondary | ICD-10-CM | POA: Diagnosis not present

## 2023-07-02 DIAGNOSIS — D2262 Melanocytic nevi of left upper limb, including shoulder: Secondary | ICD-10-CM | POA: Diagnosis not present

## 2023-07-02 DIAGNOSIS — D485 Neoplasm of uncertain behavior of skin: Secondary | ICD-10-CM | POA: Diagnosis not present

## 2023-07-02 DIAGNOSIS — I8391 Asymptomatic varicose veins of right lower extremity: Secondary | ICD-10-CM | POA: Diagnosis not present

## 2023-07-02 DIAGNOSIS — L738 Other specified follicular disorders: Secondary | ICD-10-CM | POA: Diagnosis not present

## 2023-07-02 DIAGNOSIS — L820 Inflamed seborrheic keratosis: Secondary | ICD-10-CM | POA: Diagnosis not present

## 2023-07-02 DIAGNOSIS — C44319 Basal cell carcinoma of skin of other parts of face: Secondary | ICD-10-CM | POA: Diagnosis not present

## 2023-07-02 DIAGNOSIS — D225 Melanocytic nevi of trunk: Secondary | ICD-10-CM | POA: Diagnosis not present

## 2023-07-02 DIAGNOSIS — Z85828 Personal history of other malignant neoplasm of skin: Secondary | ICD-10-CM | POA: Diagnosis not present

## 2023-07-02 DIAGNOSIS — L570 Actinic keratosis: Secondary | ICD-10-CM | POA: Diagnosis not present

## 2023-07-02 DIAGNOSIS — D2272 Melanocytic nevi of left lower limb, including hip: Secondary | ICD-10-CM | POA: Diagnosis not present

## 2023-07-13 ENCOUNTER — Ambulatory Visit: Payer: PPO | Admitting: Behavioral Health

## 2023-07-13 DIAGNOSIS — F4329 Adjustment disorder with other symptoms: Secondary | ICD-10-CM

## 2023-07-13 DIAGNOSIS — Z630 Problems in relationship with spouse or partner: Secondary | ICD-10-CM
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
## 2023-07-13 NOTE — Progress Notes (Signed)
                Timothy David Farryn Linares, LMFT 

## 2023-07-23 ENCOUNTER — Ambulatory Visit: Payer: PPO | Admitting: Behavioral Health

## 2023-07-23 DIAGNOSIS — F4329 Adjustment disorder with other symptoms: Secondary | ICD-10-CM | POA: Diagnosis not present

## 2023-07-23 DIAGNOSIS — Z630 Problems in relationship with spouse or partner: Secondary | ICD-10-CM

## 2023-07-23 NOTE — Progress Notes (Signed)
   [name], LMFT

## 2023-07-23 NOTE — Progress Notes (Addendum)
 Timothy David  Patient ID: Timothy David, MRN: [id number],    Date: 07/23/2023  Time Spent: 55 min Caregility video; Pt is home in private & Provider is working remotely from [employer]. Pt is aware of the risks/limitations of telehealth & consents to Tx today.  Time In: 4:00pm Time Out: 4:55pm   Treatment Type: Individual Therapy  Reported Symptoms: Reduced anx/dep & stress  Mental Status Exam: Appearance:  Casual     Behavior: Appropriate and Sharing  Motor: Normal  Speech/Language:  Clear and Coherent  Affect: Appropriate  Mood: normal  Thought process: normal  Thought content:   WNL  Sensory/Perceptual disturbances:   WNL  Orientation: oriented to person, place, time/date, and situation  Attention: Good  Concentration: Good  Memory: WNL  Fund of knowledge:  Good  Insight:   Good  Judgment:  Good  Impulse Control: Good   Risk Assessment: Danger to Self:  No Self-injurious Behavior: No Danger to Others: No Duty to Warn:No Physical Aggression / Violence:No  Access to Firearms a concern: No  Gang Involvement:No   Subjective: Pt is positive about the marital situation. His Family is changing for the better. His children are aging & more mature. His children are Timothy David. He has recently re-connected w/his Son Timothy David in [place] . He visited over the [place] w/Son, Timothy David & his Ex-David & his current Spouse Timothy David.   The [name] is planning a Family Trip this Summer to reward the kids for completing their respective School Graduations.    P tis trying to inc the comfort levels they feel w/ea other.  Interventions: Family Systems  Diagnosis:Adjustment disorder with other symptom  Marital conflict  Plan: Timothy David is concerned for the strength of the marriage & wants his David to be happy. He is doing more in the home & flipping the roles a bit so Timothy David does not have to navigate so much around his  career any longer. Introduced the [employer]; every 2 wks have a date night, every 2 mos have a wknd trip planned & every 2 yrs take a week's vacation. Timothy David agreed w/this idea.  Target Date: 08/13/2023  Progress: 5  Frequency: Once every 2 wks  Modality: Timothy Richerd LITTIE Hollace, LMFT

## 2023-07-27 ENCOUNTER — Ambulatory Visit (INDEPENDENT_AMBULATORY_CARE_PROVIDER_SITE_OTHER): Payer: PPO | Admitting: Behavioral Health

## 2023-07-27 DIAGNOSIS — F4329 Adjustment disorder with other symptoms: Secondary | ICD-10-CM | POA: Diagnosis not present

## 2023-07-27 DIAGNOSIS — Z630 Problems in relationship with spouse or partner: Secondary | ICD-10-CM

## 2023-07-27 NOTE — Progress Notes (Signed)
 [place] [facility] Counselor/Therapist Progress Note  Patient ID: Timothy David, MRN: [id number],    Date: 07/27/2023  Time Spent: 55 min In Person @ [facility] - [facility] Office  Time In: 9:00am Time Out: 9:55am  Treatment Type: Family with patient  Reported Symptoms: Elevated marital dissatisfaction btwn Spouses since [name]'s partial retirement & Wife's mgmt of the home & grown children.   Mental Status Exam: Appearance:  Casual & active attire  Behavior: Appropriate and Sharing  Motor: Normal  Speech/Language:  Clear and Coherent and Normal Rate  Affect: Appropriate and Congruent  Mood: normal  Thought process: normal  Thought content:   WNL  Sensory/Perceptual disturbances:   WNL  Orientation: oriented to person, place, time/date, and situation  Attention: Good  Concentration: Good  Memory: WNL  Fund of knowledge:  Good  Insight:   Good  Judgment:  Good  Impulse Control: Good   Risk Assessment: Danger to Self:  No Self-injurious Behavior: No Danger to Others: No Duty to Warn:no Physical Aggression / Violence:No  Access to Firearms a concern: No  Gang Involvement:No   Subjective: Pt & Wife here today to discuss plans for the grown children & elements of their problems currently. [name] wants better communication to enhance the marital bond & richer understandings of ea other.    Instructed [name] for the Paper Exercise. One vital thing not your children or marriage?  L - relationships/connections/communication  E - curiosity about the World daily - reading  L -  start anew E - your competitiveness, L - I'll win!  E - Cycling is my meditation  Who gets the paper? E pulled it out of L's hand!  L - I want him to get to the finish line w/things, I can't trust he will get it done  Interventions:  EFT & The Couple's Paper Exercise & deconstruction via conversation  Diagnosis:Adjustment disorder with other symptom  Marital conflict  Plan: [name] & Bryar  learned many new things about ea other today (see above quotes). To improve communication, they will attempt to speak thoughts to ea other out loud that they have been keeping to themselves. This could be narrating something to your Partner, or just sharing thoughts & feelings more about home & the children. They will make these efforts & report back next session.    Target Date: 08/29/2023   Progress: 7   Frequency: Once every 2-3 wks   Modality: [name] Th Next session we will introduce the ideas around Sexual Communication.  [name], LMFT

## 2023-07-27 NOTE — Progress Notes (Signed)
   [name], LMFT

## 2023-08-10 ENCOUNTER — Ambulatory Visit (INDEPENDENT_AMBULATORY_CARE_PROVIDER_SITE_OTHER): Payer: PPO | Admitting: Behavioral Health

## 2023-08-10 DIAGNOSIS — F4329 Adjustment disorder with other symptoms: Secondary | ICD-10-CM | POA: Diagnosis not present

## 2023-08-10 DIAGNOSIS — Z630 Problems in relationship with spouse or partner: Secondary | ICD-10-CM | POA: Diagnosis not present

## 2023-08-10 NOTE — Progress Notes (Signed)
[name], LMFT

## 2023-08-10 NOTE — Progress Notes (Signed)
[place] [facility] Counselor/Therapist Progress Note  Patient ID: Timothy David, MRN: [id number],    Date: 08/10/2023  Time Spent: 55 min In Person @ [facility] - [facility] Office Time In: 1:00pm Time Out: 1:55pm   Treatment Type:  [name] Th  Reported Symptoms: Elevated anx/dep & marital strain as they work to improve communication  Mental Status Exam: Appearance:  Casual     Behavior: Appropriate and Sharing  Motor: Normal  Speech/Language:  Clear and Coherent  Affect: Appropriate  Mood: normal  Thought process: normal  Thought content:   WNL  Sensory/Perceptual disturbances:   WNL  Orientation: oriented to person, place, time/date, and situation  Attention: Good  Concentration: Good  Memory: WNL  Fund of knowledge:  Good  Insight:   Good  Judgment:  Good  Impulse Control: Good   Risk Assessment: Danger to Self:  No Self-injurious Behavior: No Danger to Others: No Duty to Warn:no Physical Aggression / Violence:No  Access to Firearms a concern: No  Gang Involvement:No   Subjective: Wife discussed how a recent trip to [place] promoted her feelings of normalization/validation that all Cpls have issues, some the same & some different than her own. She reports this made her return back to the home irritating when [name] did not f/u on Dtr Timothy David's beh & current need to comply w/Community Serv obligation.  [name] did not recognize her irritation w/him & Wife did not share this information. Instead, their current pattern is to ignore a lot & not dig deeper in conversation.   Interventions:  [name] Th using psychoedu re: being in the present, expressing yourself & depending on the small things to get closer.  Diagnosis:Adjustment disorder with other symptom  Marital conflict  Plan: Timothy David & Timothy David will pay attn to their Spouse over the next few wks by paying attn to the small gestures in order to get closer intimately. They will do one small thing ea day until our next session & record  what they do & what they notice. They will complete the 'Sexual Communication' wkshts alone & return by snail mail or @ next visit.  Target Date: 08/29/2023  Progress: 8  Frequency: Once every 2-3 wks  Modality: [name] Th  [name], LMFT

## 2023-08-24 ENCOUNTER — Ambulatory Visit: Payer: PPO | Admitting: Behavioral Health

## 2023-08-25 ENCOUNTER — Ambulatory Visit: Payer: PPO | Admitting: Behavioral Health

## 2023-08-25 DIAGNOSIS — Z630 Problems in relationship with spouse or partner: Secondary | ICD-10-CM | POA: Diagnosis not present

## 2023-08-25 DIAGNOSIS — F4329 Adjustment disorder with other symptoms: Secondary | ICD-10-CM
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
## 2023-08-25 NOTE — Progress Notes (Signed)
   [name], LMFT

## 2023-09-03 ENCOUNTER — Other Ambulatory Visit: Payer: Self-pay | Admitting: Medical Genetics

## 2023-09-07 ENCOUNTER — Ambulatory Visit (INDEPENDENT_AMBULATORY_CARE_PROVIDER_SITE_OTHER): Payer: PPO | Admitting: Behavioral Health

## 2023-09-07 DIAGNOSIS — Z630 Problems in relationship with spouse or partner: Secondary | ICD-10-CM | POA: Diagnosis not present

## 2023-09-07 DIAGNOSIS — F4329 Adjustment disorder with other symptoms: Secondary | ICD-10-CM

## 2023-09-07 NOTE — Progress Notes (Signed)
   Timothy Lever, LMFT

## 2023-09-07 NOTE — Progress Notes (Signed)
 [place] [facility] Counselor/Therapist Progress Note  Patient ID: Timothy David, MRN: [id number],    Date: 09/07/2023  Time Spent: 55 min In Person @ [facility]- [facility] Office Time In: 1:00pm Time Out: 1:55pm   Treatment Type:  [name] Th  Reported Symptoms: Stable levels of anx/dep & marital stress since communication has inc'd & Family issues have been secured.   Mental Status Exam: Appearance:  Casual     Behavior: Appropriate and Sharing  Motor: Normal  Speech/Language:  Clear and Coherent  Affect: Appropriate  Mood: normal  Thought process: normal  Thought content:   WNL  Sensory/Perceptual disturbances:   WNL  Orientation: oriented to person, place, time/date, and situation  Attention: Good  Concentration: Good  Memory: WNL  Fund of knowledge:  Good  Insight:   Good  Judgment:  Good  Impulse Control: Good   Risk Assessment: Danger to Self:  No Self-injurious Behavior: No Danger to Others: No Duty to Warn:no Physical Aggression / Violence:No  Access to Firearms a concern: No  Gang Involvement:No   Subjective: All three children seem to be doing well. Son [name] has accepted job in [place] & is hesitant to move in May. Dtr [name] is graduating in May & Son Brigitte Pulse is also graduating in May. Much eustress for the Family as they encounter these positive events.    Kalik & [name] cont to stretch their efforts towards inc'd communication as a [name]; about the children & also about the marital relationship. Introduced the concept of comfort levels & understandings about how they can inc the freq & comfort of these discussions on a natural & normal level that suits them adequately.   [name] often opens up communication through teasing, lite-playfulness, & flirting. They will cont to notice these bids for attn & take them a step further to inc & deepen their conversational intimacy. They want to set a solid, positive example for the children in the home.  Interventions: Family Systems  and Communication Skills  Diagnosis:Adjustment disorder with other symptom  Marital conflict  Plan: [name] & [name] will pay attn for the bids for connection in the daily life they have estb'd. They will intentionally use these bids to gain closeness & intimacy. [name] will try to be more assertive about her communications to [employer] will do the same. They will complete the 'Sexual Communication Worksheet-Part B' & next session we will wrap up the discussion re: Part A.  Target Date: 09/26/2023  Progress: 6  Frequency: Once every 2-3 wks  Modality: [name] Th  [name], LMFT

## 2023-09-11 ENCOUNTER — Other Ambulatory Visit: Payer: PPO

## 2023-09-11 DIAGNOSIS — Z006 Encounter for examination for normal comparison and control in clinical research program: Secondary | ICD-10-CM

## 2023-09-14 ENCOUNTER — Other Ambulatory Visit: Payer: PPO

## 2023-09-21 ENCOUNTER — Ambulatory Visit (INDEPENDENT_AMBULATORY_CARE_PROVIDER_SITE_OTHER): Payer: PPO | Admitting: Behavioral Health

## 2023-09-21 DIAGNOSIS — Z630 Problems in relationship with spouse or partner: Secondary | ICD-10-CM | POA: Diagnosis not present

## 2023-09-21 DIAGNOSIS — F4329 Adjustment disorder with other symptoms: Secondary | ICD-10-CM

## 2023-09-21 NOTE — Progress Notes (Signed)
   [name], LMFT

## 2023-09-22 LAB — GENECONNECT MOLECULAR SCREEN: Genetic Analysis Overall Interpretation: NEGATIVE

## 2023-10-05 ENCOUNTER — Ambulatory Visit (INDEPENDENT_AMBULATORY_CARE_PROVIDER_SITE_OTHER): Payer: PPO | Admitting: Behavioral Health

## 2023-10-05 DIAGNOSIS — Z630 Problems in relationship with spouse or partner: Secondary | ICD-10-CM | POA: Diagnosis not present

## 2023-10-05 DIAGNOSIS — F4329 Adjustment disorder with other symptoms: Secondary | ICD-10-CM

## 2023-10-05 NOTE — Progress Notes (Signed)
   Timothy Lever, LMFT

## 2023-10-19 ENCOUNTER — Ambulatory Visit (INDEPENDENT_AMBULATORY_CARE_PROVIDER_SITE_OTHER): Payer: PPO | Admitting: Behavioral Health

## 2023-10-19 DIAGNOSIS — Z630 Problems in relationship with spouse or partner: Secondary | ICD-10-CM | POA: Diagnosis not present

## 2023-10-19 DIAGNOSIS — F4329 Adjustment disorder with other symptoms: Secondary | ICD-10-CM

## 2023-10-19 NOTE — Progress Notes (Signed)
   Timothy Lever, LMFT

## 2023-10-19 NOTE — Progress Notes (Signed)
 [name]  Patient ID: Timothy David, MRN: [id number],    Date: 10/19/2023  Time Spent: 50 min In Person @ [facility] - [facility] Office Time In: 1:00pm Time Out: 1:50pm   Treatment Type:  [name] Th  Reported Symptoms: Elevated anx/dep & marital distress has cont'd due to lack of communication.  Mental Status Exam: Appearance:  Casual     Behavior: Appropriate and Sharing  Motor: Normal  Speech/Language:  Clear and Coherent  Affect: Appropriate  Mood: normal  Thought process: normal  Thought content:   WNL  Sensory/Perceptual disturbances:   WNL  Orientation: oriented to person, place, time/date, and situation  Attention: Good  Concentration: Good  Memory: WNL  Fund of knowledge:  Good  Insight:   Good  Judgment:  Good  Impulse Control: Good   Risk Assessment: Danger to Self:  No Self-injurious Behavior: No Danger to Others: No Duty to Warn:no Physical Aggression / Violence:No  Access to Firearms a concern: No  Gang Involvement:No   Subjective: Pt & Wife present today to discuss the 2 Letters written in 2024 (May & July) prior to Pt's Cycling Trip to [place]. Discussed the main points of the second letter & responses made by Pt to his Wife.  Wife c/o not being heard by Pt & Pt admits he is not a good listener @ times. Wife becomes frustrated communicating the same thing multiple times. We discussed Gottman's Research that 69% of all arguments are chronic. What can they ea accept & expect.  Conducted a Role Switch exercise so ea Partner can argue about the cleanliness of the house from the other's perspective. We discussed what they ea learned about their Partner from this technique. [name] realized they ea need to listen more intently & argue a bit longer until they compromise or understand the other person's perspective. Pt was particularly adept @ role playing his Wife's side of the argument. Pointed out this means he has been listening,  but not following through enough. He learned he wants to anticipate her feelings more.   Wife learned to be more direct/short & sweet, less "gentle". The [name] will practice this technique as indicated & talk in the car to cont the therapeutic discussion.    Interventions:  Relational work using [employer] & Role Switching argument technique  Diagnosis:Adjustment disorder with other symptom  Marital conflict  Plan: Next visit [name] will report back about communication efforts or we will conduct a Family session-as they are able to get the kids in for the visit.   Target Date: 11/11/2023  Progress: 6  Frequency: Once every 2-3 wks  Modality: [name] Th  [name], LMFT

## 2023-11-02 ENCOUNTER — Ambulatory Visit: Payer: PPO | Admitting: Behavioral Health

## 2023-11-02 DIAGNOSIS — M542 Cervicalgia: Secondary | ICD-10-CM | POA: Diagnosis not present

## 2023-11-02 DIAGNOSIS — M25559 Pain in unspecified hip: Secondary | ICD-10-CM | POA: Diagnosis not present

## 2023-11-02 DIAGNOSIS — M545 Low back pain, unspecified: Secondary | ICD-10-CM | POA: Diagnosis not present

## 2023-11-04 DIAGNOSIS — M545 Low back pain, unspecified: Secondary | ICD-10-CM | POA: Diagnosis not present

## 2023-11-04 DIAGNOSIS — M25559 Pain in unspecified hip: Secondary | ICD-10-CM | POA: Diagnosis not present

## 2023-11-04 DIAGNOSIS — M542 Cervicalgia: Secondary | ICD-10-CM | POA: Diagnosis not present

## 2023-11-11 DIAGNOSIS — M545 Low back pain, unspecified: Secondary | ICD-10-CM | POA: Diagnosis not present

## 2023-11-11 DIAGNOSIS — M542 Cervicalgia: Secondary | ICD-10-CM | POA: Diagnosis not present

## 2023-11-11 DIAGNOSIS — M25559 Pain in unspecified hip: Secondary | ICD-10-CM | POA: Diagnosis not present

## 2023-11-13 DIAGNOSIS — M545 Low back pain, unspecified: Secondary | ICD-10-CM | POA: Diagnosis not present

## 2023-11-13 DIAGNOSIS — M25559 Pain in unspecified hip: Secondary | ICD-10-CM | POA: Diagnosis not present

## 2023-11-13 DIAGNOSIS — M542 Cervicalgia: Secondary | ICD-10-CM | POA: Diagnosis not present

## 2023-11-16 DIAGNOSIS — M542 Cervicalgia: Secondary | ICD-10-CM | POA: Diagnosis not present

## 2023-11-16 DIAGNOSIS — M25559 Pain in unspecified hip: Secondary | ICD-10-CM | POA: Diagnosis not present

## 2023-11-16 DIAGNOSIS — M545 Low back pain, unspecified: Secondary | ICD-10-CM | POA: Diagnosis not present

## 2023-11-18 DIAGNOSIS — M542 Cervicalgia: Secondary | ICD-10-CM | POA: Diagnosis not present

## 2023-11-18 DIAGNOSIS — M545 Low back pain, unspecified: Secondary | ICD-10-CM | POA: Diagnosis not present

## 2023-11-18 DIAGNOSIS — M25559 Pain in unspecified hip: Secondary | ICD-10-CM | POA: Diagnosis not present

## 2023-11-23 DIAGNOSIS — M25559 Pain in unspecified hip: Secondary | ICD-10-CM | POA: Diagnosis not present

## 2023-11-23 DIAGNOSIS — M542 Cervicalgia: Secondary | ICD-10-CM | POA: Diagnosis not present

## 2023-11-23 DIAGNOSIS — M545 Low back pain, unspecified: Secondary | ICD-10-CM | POA: Diagnosis not present

## 2023-11-23 NOTE — Progress Notes (Signed)
   [name], LMFT
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
## 2023-11-24 ENCOUNTER — Ambulatory Visit (INDEPENDENT_AMBULATORY_CARE_PROVIDER_SITE_OTHER): Admitting: Behavioral Health

## 2023-11-24 DIAGNOSIS — Z630 Problems in relationship with spouse or partner: Secondary | ICD-10-CM | POA: Diagnosis not present

## 2023-11-24 DIAGNOSIS — F4329 Adjustment disorder with other symptoms: Secondary | ICD-10-CM | POA: Diagnosis not present

## 2023-11-24 NOTE — Progress Notes (Signed)
   Timothy Lever, LMFT
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
## 2023-11-25 DIAGNOSIS — M542 Cervicalgia: Secondary | ICD-10-CM | POA: Diagnosis not present

## 2023-11-25 DIAGNOSIS — M25559 Pain in unspecified hip: Secondary | ICD-10-CM | POA: Diagnosis not present

## 2023-11-25 DIAGNOSIS — M545 Low back pain, unspecified: Secondary | ICD-10-CM | POA: Diagnosis not present

## 2023-12-01 DIAGNOSIS — M25559 Pain in unspecified hip: Secondary | ICD-10-CM | POA: Diagnosis not present

## 2023-12-01 DIAGNOSIS — M542 Cervicalgia: Secondary | ICD-10-CM | POA: Diagnosis not present

## 2023-12-01 DIAGNOSIS — M545 Low back pain, unspecified: Secondary | ICD-10-CM | POA: Diagnosis not present

## 2023-12-04 DIAGNOSIS — M542 Cervicalgia: Secondary | ICD-10-CM | POA: Diagnosis not present

## 2023-12-04 DIAGNOSIS — M545 Low back pain, unspecified: Secondary | ICD-10-CM | POA: Diagnosis not present

## 2023-12-04 DIAGNOSIS — M25559 Pain in unspecified hip: Secondary | ICD-10-CM | POA: Diagnosis not present

## 2023-12-22 ENCOUNTER — Ambulatory Visit: Admitting: Behavioral Health

## 2023-12-22 DIAGNOSIS — M25559 Pain in unspecified hip: Secondary | ICD-10-CM | POA: Diagnosis not present

## 2023-12-22 DIAGNOSIS — F4329 Adjustment disorder with other symptoms: Secondary | ICD-10-CM

## 2023-12-22 DIAGNOSIS — M542 Cervicalgia: Secondary | ICD-10-CM | POA: Diagnosis not present

## 2023-12-22 DIAGNOSIS — M545 Low back pain, unspecified: Secondary | ICD-10-CM | POA: Diagnosis not present

## 2023-12-22 NOTE — Progress Notes (Signed)
   [name], LMFT

## 2023-12-22 NOTE — Progress Notes (Addendum)
 [facility] Counselor/Therapist Progress Note  Patient ID: NILAY MANGRUM, MRN: [id number],    Date: 12/22/2023  Time Spent: 50 min Caregility video; [name] is @ home in private & Provider is working remotely from [employer]. [name] is aware of the risks/limitations of telehealth & consent to Tx today. Time In: 9:00am Time Out: 9:50am   Treatment Type: [name] Th  Reported Symptoms: Reduction in anx/dep & marital distress  Mental Status Exam: Appearance:  Casual     Behavior: Appropriate and Sharing  Motor: Normal  Speech/Language:  Clear and Coherent  Affect: Appropriate  Mood: normal  Thought process: normal  Thought content:   WNL  Sensory/Perceptual disturbances:   WNL  Orientation: oriented to person, place, time/date, and situation  Attention: Good  Concentration: Good  Memory: WNL  Fund of knowledge:  Good  Insight:   Good  Judgment:  Good  Impulse Control: Good   Risk Assessment: Danger to Self:  No Self-injurious Behavior: No Danger to Others: No Duty to Warn:no Physical Aggression / Violence:No  Access to Firearms a concern: No  Gang Involvement:No   Subjective: [name] have been through one of the most stressful months since their marriage began. Their 3 children are all launching from the home & they will be empty nesters in the Fall. They want to focus their work on this element.   Pt has 2 grown children; 69yo Dtr [facility] & 69yo Son Carlus Chihuahua. The [name]'s 3 children are also grown, just younger. Wife sts they have never been w/o children in the relationship bc Pt's 2 original Adult Children were already born when their relationship began. Wife is worried for this adjustment in the Fall. They want to reconnect & date again. Pt is worried for the emot'l aspect of the changes this Fall.  The past 2 wks were spent separated due to Pt's trip w/middle Son [name] to [place] for his Apt set-up & new job transition. During this time he focused on his Son & did not call Wife.  She was disappointed they only text, but she did not want to call.    Interventions: Family Systems  Diagnosis:Adjustment disorder with other symptom  Plan: [name] will focus on Pt's need to catch up on his Dtr's & oldest Son's needs since his return from [place]. Wife will focus on assisting to catch Pt up on these 2 children & in turn, this will promote Wife's focus on herself & her interests.  Target Date: 01/12/2024  Progress: 6  Frequency: Once every 2-3 wks  Modality: [name] Th  Elroy Hallmark, LMFT

## 2023-12-25 DIAGNOSIS — M545 Low back pain, unspecified: Secondary | ICD-10-CM | POA: Diagnosis not present

## 2023-12-25 DIAGNOSIS — M25559 Pain in unspecified hip: Secondary | ICD-10-CM | POA: Diagnosis not present

## 2023-12-25 DIAGNOSIS — M542 Cervicalgia: Secondary | ICD-10-CM | POA: Diagnosis not present

## 2023-12-28 DIAGNOSIS — M542 Cervicalgia: Secondary | ICD-10-CM | POA: Diagnosis not present

## 2023-12-28 DIAGNOSIS — M25559 Pain in unspecified hip: Secondary | ICD-10-CM | POA: Diagnosis not present

## 2023-12-28 DIAGNOSIS — M545 Low back pain, unspecified: Secondary | ICD-10-CM | POA: Diagnosis not present

## 2024-01-01 DIAGNOSIS — M542 Cervicalgia: Secondary | ICD-10-CM | POA: Diagnosis not present

## 2024-01-01 DIAGNOSIS — M25559 Pain in unspecified hip: Secondary | ICD-10-CM | POA: Diagnosis not present

## 2024-01-01 DIAGNOSIS — M545 Low back pain, unspecified: Secondary | ICD-10-CM | POA: Diagnosis not present

## 2024-01-04 DIAGNOSIS — M542 Cervicalgia: Secondary | ICD-10-CM | POA: Diagnosis not present

## 2024-01-04 DIAGNOSIS — M25559 Pain in unspecified hip: Secondary | ICD-10-CM | POA: Diagnosis not present

## 2024-01-04 DIAGNOSIS — M545 Low back pain, unspecified: Secondary | ICD-10-CM | POA: Diagnosis not present

## 2024-01-06 ENCOUNTER — Ambulatory Visit (INDEPENDENT_AMBULATORY_CARE_PROVIDER_SITE_OTHER): Admitting: Behavioral Health

## 2024-01-06 DIAGNOSIS — Z630 Problems in relationship with spouse or partner: Secondary | ICD-10-CM

## 2024-01-06 DIAGNOSIS — F4329 Adjustment disorder with other symptoms: Secondary | ICD-10-CM | POA: Diagnosis not present

## 2024-01-06 NOTE — Progress Notes (Signed)
   [name], LMFT
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
## 2024-01-07 DIAGNOSIS — M542 Cervicalgia: Secondary | ICD-10-CM | POA: Diagnosis not present

## 2024-01-07 DIAGNOSIS — M545 Low back pain, unspecified: Secondary | ICD-10-CM | POA: Diagnosis not present

## 2024-01-07 DIAGNOSIS — M25559 Pain in unspecified hip: Secondary | ICD-10-CM | POA: Diagnosis not present

## 2024-01-14 ENCOUNTER — Other Ambulatory Visit: Payer: Self-pay | Admitting: Internal Medicine

## 2024-01-14 DIAGNOSIS — J0100 Acute maxillary sinusitis, unspecified: Secondary | ICD-10-CM

## 2024-01-14 DIAGNOSIS — M545 Low back pain, unspecified: Secondary | ICD-10-CM | POA: Diagnosis not present

## 2024-01-14 DIAGNOSIS — M25559 Pain in unspecified hip: Secondary | ICD-10-CM | POA: Diagnosis not present

## 2024-01-14 DIAGNOSIS — M542 Cervicalgia: Secondary | ICD-10-CM | POA: Diagnosis not present

## 2024-01-14 DIAGNOSIS — E785 Hyperlipidemia, unspecified: Secondary | ICD-10-CM | POA: Diagnosis not present

## 2024-01-14 DIAGNOSIS — M47812 Spondylosis without myelopathy or radiculopathy, cervical region: Secondary | ICD-10-CM | POA: Diagnosis not present

## 2024-01-14 DIAGNOSIS — J310 Chronic rhinitis: Secondary | ICD-10-CM | POA: Diagnosis not present

## 2024-01-14 DIAGNOSIS — H04123 Dry eye syndrome of bilateral lacrimal glands: Secondary | ICD-10-CM | POA: Diagnosis not present

## 2024-01-19 ENCOUNTER — Ambulatory Visit
Admission: RE | Admit: 2024-01-19 | Discharge: 2024-01-19 | Disposition: A | Discharge status: Home / Self Care | Source: Ambulatory Visit | Attending: Internal Medicine | Admitting: Internal Medicine

## 2024-01-19 ENCOUNTER — Ambulatory Visit: Admitting: Behavioral Health

## 2024-01-19 DIAGNOSIS — J0100 Acute maxillary sinusitis, unspecified: Secondary | ICD-10-CM

## 2024-01-19 DIAGNOSIS — J3489 Other specified disorders of nose and nasal sinuses: Secondary | ICD-10-CM | POA: Diagnosis not present

## 2024-01-20 ENCOUNTER — Ambulatory Visit: Admitting: Behavioral Health

## 2024-01-25 ENCOUNTER — Ambulatory Visit (INDEPENDENT_AMBULATORY_CARE_PROVIDER_SITE_OTHER): Admitting: Behavioral Health

## 2024-01-25 DIAGNOSIS — F4329 Adjustment disorder with other symptoms: Secondary | ICD-10-CM | POA: Diagnosis not present

## 2024-01-25 DIAGNOSIS — Z630 Problems in relationship with spouse or partner: Secondary | ICD-10-CM

## 2024-01-25 NOTE — Progress Notes (Signed)
 [facility] Counselor/Therapist Progress Note  Patient ID: EMMERT ROETHLER, MRN: [id number],    Date: 01/25/2024  Time Spent: 50 min Caregility video; Pt is home in private & Provider working remotely from [employer]. Pt is aware of the risks/limitations of telehealth & consents to Tx today.  Time In: 1:00pm Time Out: 1:50pm   Treatment Type: Family without patient  Reported Symptoms: Elevated anx/dep & familial stress due to [facility] Trip this past wknd  Mental Status Exam: Appearance:  Casual     Behavior: Appropriate and Sharing  Motor: Normal  Speech/Language:  Clear and Coherent  Affect: Appropriate  Mood: normal & anxious  Thought process: normal  Thought content:   WNL  Sensory/Perceptual disturbances:   WNL  Orientation: oriented to person, place, time/date, and situation  Attention: Good  Concentration: Good  Memory: WNL  Fund of knowledge:  Good  Insight:   Good  Judgment:  Good  Impulse Control: Good   Risk Assessment: Danger to Self:  No Self-injurious Behavior: No Danger to Others: No Duty to Warn:no Physical Aggression / Violence:No  Access to Firearms a concern: No  Gang Involvement:No   Subjective: Pt's Wife is burnt out on the Family responsibilities & being in charge of all the details of the home. She has tried various ways to communicate her own needs. [name] is not fully listening to her when she even makes requests.   Over the [facility] Trip last wknd, Wife had concerns for [name]'s attn to their oldest Son's drinking. She is upset for his lack of reinforcing her concerns about the consumption of ETOH.   Interventions: Family Systems & Psychoedu re: ETOH & Family dvlpmt  Diagnosis:Adjustment disorder with other symptom  Marital stress  Plan: [name] is a bit scattered after the [employer] Family took over a long wknd. She is concerned for her oldest Son's drinking beh & she did not feel this concern was validated by Pt when they were  on the trip. We discussed the [name]'s pattern of over/underfunctioning & how [name] can assist the situation to feel more equitable to her. We will address Pt's tendency to be more passive in the Parenting role & how to inc his participation in care for the home. [name] cont to have issues w/the use of tech. We will put a Family Agreement in place for tech next visit. Pt will address how he decides it is convenient to ignore his Wife & if this functions to keep a distance he is comfortable with or not.  Target Date: 02/12/2024  Progress: 5  Frequency: Once every 2-3 wks  Modality: Family w/o Pt  [name], LMFT

## 2024-01-25 NOTE — Progress Notes (Deleted)
 [facility] Counselor/Therapist Progress Note  Patient ID: Timothy David, MRN: [id number]    Date: 01/25/24  Time Spent: ***  {LBBHAMPM:26719} - *** {LBBHAMPM:26719} : *** Minutes  Treatment Type: Individual Therapy.  Reported Symptoms: ***  Mental Status Exam: Appearance:  {PSY:22683}     Behavior: {PSY:21022743}  Motor: {PSY:22302}  Speech/Language:  {PSY:22685}  Affect: {PSY:22687}  Mood: {PSY:31886}  Thought process: {PSY:31888}  Thought content:   {PSY:[phone]}  Sensory/Perceptual disturbances:   {PSY:714-233-0669}  Orientation: {PSY:30297}  Attention: {PSY:22877}  Concentration: {[phone]}  Memory: {PSY:520-384-0481}  Fund of knowledge:  {[phone]}  Insight:   {[phone]}  Judgment:  {[phone]}  Impulse Control: {[phone]}   Risk Assessment: Danger to Self:  {PSY:22692} Self-injurious Behavior: {PSY:22692} Danger to Others: {PSY:22692} Duty to Warn:{PSY:311194} Physical Aggression / Violence:{PSY:21197} Access to Firearms a concern: {PSY:21197} Gang Involvement:{PSY:21197}  Subjective:   Timothy David participated in the session, in person in the office with the therapist, and consented to treatment.   ***   Interventions: {[phone]}  Diagnosis:  No diagnosis found.   Plan: ***Patient is to use CBT, mindfulness and coping skills to help manage decrease symptoms associated with their diagnosis.   Long-term goal:   ***Reduce overall level, frequency, and intensity of the feelings of depression, anxiety and panic evidenced by       decreased irritability, negative self talk, and helpless feelings from 6 to 7 days/week to 0 to 1 days/week per client report for at least 3 consecutive months.  Short-term goal:  ***Verbally express understanding of the relationship between feelings of depression, anxiety and their impact on thinking patterns and behaviors. Verbalize an understanding of the role that distorted  thinking plays in creating fears, excessive worry, and ruminations.  Timothy LITTIE Ling, LMFT

## 2024-01-29 DIAGNOSIS — M542 Cervicalgia: Secondary | ICD-10-CM | POA: Diagnosis not present

## 2024-01-29 DIAGNOSIS — M25559 Pain in unspecified hip: Secondary | ICD-10-CM | POA: Diagnosis not present

## 2024-01-29 DIAGNOSIS — M545 Low back pain, unspecified: Secondary | ICD-10-CM | POA: Diagnosis not present

## 2024-01-30 NOTE — Progress Notes (Signed)
 [facility] Counselor/Therapist Progress Note  Patient ID: Timothy David, MRN: [id number],    Date: 10/05/2023  Time Spent: 55 min In Person @ [facility] Time In: 1:00pm  Time Out: 1:55pm  Treatment Type: [name] Th  Reported Symptoms: Elevated anx/dep & distress due to marital situation & pending launch circumstances for 3 children by this Fall  Mental Status Exam: Appearance:  Casual     Behavior: Appropriate and Sharing  Motor: Normal  Speech/Language:  Clear and Coherent  Affect: Appropriate  Mood: depressed  Thought process: normal  Thought content:   WNL  Sensory/Perceptual disturbances:   WNL  Orientation: oriented to person, place, time/date, and situation  Attention: Good  Concentration: Good  Memory: WNL  Fund of knowledge:  Good  Insight:   Fair  Judgment:  Good  Impulse Control: Good   Risk Assessment: Danger to Self:  No Self-injurious Behavior: No Danger to Others: No Duty to Warn:no Physical Aggression / Violence:No  Access to Firearms a concern: No  Gang Involvement:No   Subjective: Today, Timothy David is discussing her resentment about Pt's trip to cycle in [place] for 2 wks after his official Retirement in July 2024. At first she was furious w/him for being so selfish & self-absorbed. Her resentment has lessened now. At the time, she wrote him 2 letters; one in April of 2024 & one in July. She describes these as, I'm done letters! She emailed the one in July as he was in [place]. He responded line by line & she did not understand what a good medical Wife meant.   [name] wants to engage w/other Families like them & to have connections @ Church for support, companionship, & like-minded friends. Today, Pt does not know what to do to make things better. Wife instructed him to take the To Do List for Son Timothy David & get him moving to [place] !  We briefly discussed if there was need for Clinician to read the letters.  Interventions:  Insight-Oriented, Family Systems, and interactional relationship discussion  Diagnosis:Adjustment disorder with other symptom  Marital conflict  Plan: Timothy David will mail a copy of both letters to Clinician. We will consider if the Kids may want to come into a Family Session, & we will discuss the Letters to [place] as needed.   Target Date: 10/27/2023  Progress: 6  Frequency: Once every 2-3 wks  Modality: [name] Th  Timothy LITTIE Ling, LMFT

## 2024-02-03 ENCOUNTER — Ambulatory Visit: Admitting: Behavioral Health

## 2024-02-10 DIAGNOSIS — M542 Cervicalgia: Secondary | ICD-10-CM | POA: Diagnosis not present

## 2024-02-10 DIAGNOSIS — M25559 Pain in unspecified hip: Secondary | ICD-10-CM | POA: Diagnosis not present

## 2024-02-10 DIAGNOSIS — M545 Low back pain, unspecified: Secondary | ICD-10-CM | POA: Diagnosis not present

## 2024-02-16 ENCOUNTER — Ambulatory Visit (INDEPENDENT_AMBULATORY_CARE_PROVIDER_SITE_OTHER): Admitting: Behavioral Health

## 2024-02-16 DIAGNOSIS — F4329 Adjustment disorder with other symptoms: Secondary | ICD-10-CM

## 2024-02-16 DIAGNOSIS — Z630 Problems in relationship with spouse or partner: Secondary | ICD-10-CM

## 2024-02-16 NOTE — Progress Notes (Signed)
 [facility] Counselor/Therapist Progress Note  Patient ID: Timothy David, MRN: [id number],    Date: 02/16/2024  Time Spent: 50 min In Person @ [facility] Time In: 9:00am Time Out: 9:50am   Treatment Type: [name] Th  Reported Symptoms: Some reduction in marital stress  Mental Status Exam: Appearance:  Casual     Behavior: Appropriate and Sharing  Motor: Normal  Speech/Language:  Clear and Coherent  Affect: Appropriate  Mood: normal  Thought process: normal  Thought content:   WNL  Sensory/Perceptual disturbances:   WNL  Orientation: oriented to person, place, time/date, and situation  Attention: Good  Concentration: Good  Memory: WNL  Fund of knowledge:  Good  Insight:   Good  Judgment:  Good  Impulse Control: Good   Risk Assessment: Danger to Self:  No Self-injurious Behavior: No Danger to Others: No Duty to Warn:no Physical Aggression / Violence:No  Access to Firearms a concern: No  Gang Involvement:No   Subjective: Next Tue 68yo Dtr Timothy David is leaving home to go to [employer]. This will be the second child of 3 to leave the Family home this year. Wife is apprehensive & Pt seems to feel it will be positive & good timing for her. Oldest Son is expecting an Offer Letter from his Internship @ [employer]. This is also positive-[name] feels even if he takes this job it may be a while before he gets his own place.   [name] & Wife discussed the different paces they ea have in the dyad. Wife is decisive & finds solutions to issues which she initiates. Pt is deliberate, well-thought out w/issues. This fast & slow pace in processing is a challenge. [name] likes the status quo w/minimal change & Wife likes to find the next best change that is beneficial to occur & work towards that solution.    Interventions: Family Systems & [name] Th utilizing Gottman  Diagnosis:Adjustment disorder with other symptom  Marital stress  Plan: Discussed way to cooperate more fully as a Team so  the connection to [facility] & Kids can be stronger. They ea have attributes they can combine to make the home run more smoothly. They will make intentional efforts to appreciate the characteristics of ea other to fully appreciate what ea brings to the Marital Team. Timothy David will complete his [name] Questionnaire; Timothy David provided hers today. They will sit down w/the Excel Spreadsheet that [place] started ~2 yrs ago & complete this for our next session. We will discuss the process @ this time.   Target Date: 03/12/2024  Progress: 6  Frequency: Once every 2-3 wks  Modality: [name] Th  [name], LMFT

## 2024-02-17 DIAGNOSIS — M545 Low back pain, unspecified: Secondary | ICD-10-CM | POA: Diagnosis not present

## 2024-02-17 DIAGNOSIS — M542 Cervicalgia: Secondary | ICD-10-CM | POA: Diagnosis not present

## 2024-02-17 DIAGNOSIS — M25559 Pain in unspecified hip: Secondary | ICD-10-CM | POA: Diagnosis not present

## 2024-02-24 DIAGNOSIS — M25559 Pain in unspecified hip: Secondary | ICD-10-CM | POA: Diagnosis not present

## 2024-02-24 DIAGNOSIS — M545 Low back pain, unspecified: Secondary | ICD-10-CM | POA: Diagnosis not present

## 2024-02-24 DIAGNOSIS — M542 Cervicalgia: Secondary | ICD-10-CM | POA: Diagnosis not present

## 2024-03-01 ENCOUNTER — Ambulatory Visit (INDEPENDENT_AMBULATORY_CARE_PROVIDER_SITE_OTHER): Admitting: Behavioral Health

## 2024-03-01 DIAGNOSIS — F4329 Adjustment disorder with other symptoms: Secondary | ICD-10-CM

## 2024-03-01 DIAGNOSIS — Z630 Problems in relationship with spouse or partner: Secondary | ICD-10-CM

## 2024-03-01 NOTE — Progress Notes (Signed)
 [facility] Counselor/Therapist Progress Note  Patient ID: Timothy David, MRN: [id number],    Date: 03/01/2024  Time Spent: 50 min Caregility video; Pt & Wife are home in private & Provider working remotely from [employer]. [name] is aware of the risks/limitations of telehealth & consents to Tx today. Time In: 1:00pm Time Out: 1:50pm   Treatment Type: [name] Th  Reported Symptoms: Marital stressors & Family dynamics  Mental Status Exam: Appearance:  Casual     Behavior: Appropriate and Sharing  Motor: Normal  Speech/Language:  Clear and Coherent  Affect: Appropriate  Mood: normal  Thought process: normal  Thought content:   WNL  Sensory/Perceptual disturbances:   WNL  Orientation: oriented to person, place, time/date, and situation  Attention: Good  Concentration: Good  Memory: WNL  Fund of knowledge:  Good  Insight:   Good  Judgment:  Good  Impulse Control: Good   Risk Assessment: Danger to Self:  No Self-injurious Behavior: No Danger to Others: No Duty to Warn:no Physical Aggression / Violence:No  Access to Firearms a concern: No  Gang Involvement:No   Subjective: [name] is examining the state of their relationship today. Communication is key. They were instructed on how to tighten communication efforts.   Interventions: [name] Th using Gottman ;communication skills- cultivating appreciation & negating contempt  Diagnosis:Adjustment disorder with other symptom  Marital stress  Plan: [name] will use the tools introduced today to improve communication daily.  Target Date: 03/28/2024 Progress: 7 Frequency: Once every 2-3 wks Modality: [name] Th  [name], LMFT

## 2024-03-02 DIAGNOSIS — M25559 Pain in unspecified hip: Secondary | ICD-10-CM | POA: Diagnosis not present

## 2024-03-02 DIAGNOSIS — M542 Cervicalgia: Secondary | ICD-10-CM | POA: Diagnosis not present

## 2024-03-02 DIAGNOSIS — M545 Low back pain, unspecified: Secondary | ICD-10-CM | POA: Diagnosis not present

## 2024-03-09 DIAGNOSIS — M25559 Pain in unspecified hip: Secondary | ICD-10-CM | POA: Diagnosis not present

## 2024-03-09 DIAGNOSIS — M545 Low back pain, unspecified: Secondary | ICD-10-CM | POA: Diagnosis not present

## 2024-03-09 DIAGNOSIS — M542 Cervicalgia: Secondary | ICD-10-CM | POA: Diagnosis not present

## 2024-03-16 ENCOUNTER — Ambulatory Visit: Admitting: Behavioral Health

## 2024-03-16 DIAGNOSIS — F4329 Adjustment disorder with other symptoms: Secondary | ICD-10-CM

## 2024-03-16 DIAGNOSIS — M545 Low back pain, unspecified: Secondary | ICD-10-CM | POA: Diagnosis not present

## 2024-03-16 DIAGNOSIS — Z630 Problems in relationship with spouse or partner: Secondary | ICD-10-CM

## 2024-03-16 DIAGNOSIS — M542 Cervicalgia: Secondary | ICD-10-CM | POA: Diagnosis not present

## 2024-03-16 DIAGNOSIS — M25559 Pain in unspecified hip: Secondary | ICD-10-CM | POA: Diagnosis not present

## 2024-03-16 NOTE — Progress Notes (Addendum)
 [facility] Counselor/Therapist Progress Note  Patient ID: Timothy David, MRN: [id number],    Date: 03/16/2024  Time Spent: 50 min Caregility video; [name] is in private @ home & Provider is working remotely from [employer]. [name] is aware of the risks/limitations of telehealth & consents to Tx today.   Time In: 9:00am Time Out: 9:50am  Treatment Type: [name] Th  Reported Symptoms: Elevated anx due to Family dynamics revolving around the launch of all 3 children this year to transitions to [employer] new jobs.   Mental Status Exam: Appearance:  Casual     Behavior: Appropriate and Sharing  Motor: Normal  Speech/Language:  Clear and Coherent  Affect: Appropriate  Mood: normal  Thought process: normal  Thought content:   WNL  Sensory/Perceptual disturbances:   WNL  Orientation: oriented to person, place, time/date, and situation  Attention: Good  Concentration: Good  Memory: WNL  Fund of knowledge:  Good  Insight:   Good  Judgment:  Good  Impulse Control: Good   Risk Assessment: Danger to Self:  No Self-injurious Behavior: No Danger to Others: No Duty to Warn:no Physical Aggression / Violence:No  Access to Firearms a concern: No  Gang Involvement:No   Subjective: Pt shared some Family photos early in session today of 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 This Hx stretches back generations. This strong sense of working w/these animals makes Pt very proud of his Family. Today is the day after their 25th Anniversary & they are planning to travel over one of the next few wknds to celebrate alone.   Discussed all 3 children today @ [name]'s preference. Dtr [name] [phone]) has gotten herself in legal trouble, partially due to a friend's encouragement & her impulsivity. This has caused her Parents to get angry & also help her navigate the fines.  Son [name] [phone]) has secured a Mgmt job in [place], [place] & moves the mid-wknd in Sept. He has been highly anxious about the move  & having expectations of his Parents financially that they do not agree with @ this time. Both Parents express difficulty having discussions w/him due to his defensive responding & some resentments expressed about Dad's absence while growing up.  Son [name] in in [place]  & seems to be doing well @ his new job started earlier in the Summer. Parents describe him as a young Adult who will speak up & communication is easier w/him than the other 2 children.  Interventions: Family Systems and communication strategies to develop btwn the [name] & also transfer to discussions w/the 3 children  Diagnosis:Adjustment disorder with other symptom  Marital stress  Plan: Next session we will focus again on the needs of the [name] themselves to discuss communication & the adjustment they feel to an [employer]. They have never been w/out children in the marriage & both seem to want to estb a new normal.  Target Date: 04/12/2024  Progress: 6  Frequency: Once every 2-3 wks  Modality: [name] Th  [name], LMFT

## 2024-03-23 DIAGNOSIS — M542 Cervicalgia: Secondary | ICD-10-CM | POA: Diagnosis not present

## 2024-03-23 DIAGNOSIS — M25559 Pain in unspecified hip: Secondary | ICD-10-CM | POA: Diagnosis not present

## 2024-03-23 DIAGNOSIS — M545 Low back pain, unspecified: Secondary | ICD-10-CM | POA: Diagnosis not present

## 2024-03-29 ENCOUNTER — Ambulatory Visit: Admitting: Behavioral Health

## 2024-03-30 DIAGNOSIS — M542 Cervicalgia: Secondary | ICD-10-CM | POA: Diagnosis not present

## 2024-03-30 DIAGNOSIS — M25559 Pain in unspecified hip: Secondary | ICD-10-CM | POA: Diagnosis not present

## 2024-03-30 DIAGNOSIS — M545 Low back pain, unspecified: Secondary | ICD-10-CM | POA: Diagnosis not present

## 2024-04-05 ENCOUNTER — Ambulatory Visit: Admitting: Behavioral Health

## 2024-04-05 DIAGNOSIS — F4329 Adjustment disorder with other symptoms: Secondary | ICD-10-CM | POA: Diagnosis not present

## 2024-04-05 DIAGNOSIS — Z630 Problems in relationship with spouse or partner: Secondary | ICD-10-CM

## 2024-04-05 NOTE — Progress Notes (Signed)
 [facility] Counselor/Therapist Progress Note  Patient ID: Timothy David, MRN: [id number],    Date: 04/05/2024  Time Spent: 50 min Caregility video; [name] is home in private & Provider working remotely from [employer]. [name] is aware of there risks/limitations of telehealth & consents to Tx today. Time In: 10:00am Time Out: 10:50am   Treatment Type: [name] Th  Reported Symptoms: Some reduction in stress/anx due to transition into 'Empty Gladies' situation.   Mental Status Exam: Appearance:  Casual     Behavior: Appropriate and Sharing  Motor: Normal  Speech/Language:  Clear and Coherent  Affect: Appropriate  Mood: normal  Thought process: normal  Thought content:   WNL  Sensory/Perceptual disturbances:   WNL  Orientation: oriented to person, place, time/date, and situation  Attention: Good  Concentration: Good  Memory: WNL  Fund of knowledge:  Good  Insight:   Good  Judgment:  Good  Impulse Control: Good   Risk Assessment: Danger to Self:  No Self-injurious Behavior: No Danger to Others: No Duty to Warn:no Physical Aggression / Violence:No  Access to Firearms a concern: No  Gang Involvement:No   Subjective: [name] expresses the improvements they see in the marital relationship today. The last of the 3 children has moved out w/a new job in [employer]. He is a good Visual merchandiser, but was making his life difficult prior to leaving.   Wife sts she has a lowered sense of responsibility currently w/all the children out of the home. The [name] are truly empty nesters. She is trying to stay busy so she will not overthink too much.   [name] is spending more time tgthr, even though they have many sep interests. They tackle tasks & are planning time for nights out.   Pt is trying to anticipate & think about his Wife's needs & what she wants for the Family @ this point. They are trying to pay more attn to ea other & listen more/talk less.  Interventions: EFT &  Gottman  Diagnosis:Adjustment disorder with other symptom  Marital stress  Plan: [name] & [name] will try to be more curious about ea other & cont the deliberatenes of plans & companionship. They plan to work on being more intimate physically when [name] has totally healed from her procedure. In the mean time, communication has improved & they are planning some travel trips alone.  Target Date:04/27/2024  Progress:7  Frequency: Once every 2-3 wks  Modality: [name] Th  [name], LMFT

## 2024-04-14 DIAGNOSIS — M542 Cervicalgia: Secondary | ICD-10-CM | POA: Diagnosis not present

## 2024-04-14 DIAGNOSIS — M545 Low back pain, unspecified: Secondary | ICD-10-CM | POA: Diagnosis not present

## 2024-04-14 DIAGNOSIS — M25559 Pain in unspecified hip: Secondary | ICD-10-CM | POA: Diagnosis not present

## 2024-04-18 ENCOUNTER — Ambulatory Visit (INDEPENDENT_AMBULATORY_CARE_PROVIDER_SITE_OTHER): Admitting: Otolaryngology

## 2024-04-18 ENCOUNTER — Encounter (INDEPENDENT_AMBULATORY_CARE_PROVIDER_SITE_OTHER): Payer: Self-pay | Admitting: Otolaryngology

## 2024-04-18 VITALS — BP 132/80 | HR 72 | Temp 98.2°F | Ht 69.0 in | Wt 175.0 lb

## 2024-04-18 DIAGNOSIS — J310 Chronic rhinitis: Secondary | ICD-10-CM | POA: Diagnosis not present

## 2024-04-18 DIAGNOSIS — J343 Hypertrophy of nasal turbinates: Secondary | ICD-10-CM | POA: Diagnosis not present

## 2024-04-18 DIAGNOSIS — R0981 Nasal congestion: Secondary | ICD-10-CM | POA: Diagnosis not present

## 2024-04-18 DIAGNOSIS — J324 Chronic pansinusitis: Secondary | ICD-10-CM

## 2024-04-18 DIAGNOSIS — J342 Deviated nasal septum: Secondary | ICD-10-CM

## 2024-04-18 DIAGNOSIS — Z87891 Personal history of nicotine dependence: Secondary | ICD-10-CM | POA: Diagnosis not present

## 2024-04-18 DIAGNOSIS — R0982 Postnasal drip: Secondary | ICD-10-CM

## 2024-04-18 MED ORDER — IPRATROPIUM BROMIDE 0.06 % NA SOLN: 2.0000 | Freq: Two times a day (BID) | NASAL | 12 refills | Status: AC | PRN

## 2024-04-20 DIAGNOSIS — M25559 Pain in unspecified hip: Secondary | ICD-10-CM | POA: Diagnosis not present

## 2024-04-20 DIAGNOSIS — M542 Cervicalgia: Secondary | ICD-10-CM | POA: Diagnosis not present

## 2024-04-20 DIAGNOSIS — M545 Low back pain, unspecified: Secondary | ICD-10-CM | POA: Diagnosis not present

## 2024-04-21 ENCOUNTER — Ambulatory Visit: Admitting: Behavioral Health

## 2024-04-21 DIAGNOSIS — R0982 Postnasal drip: Secondary | ICD-10-CM | POA: Insufficient documentation

## 2024-04-21 DIAGNOSIS — J343 Hypertrophy of nasal turbinates: Secondary | ICD-10-CM | POA: Insufficient documentation

## 2024-04-21 DIAGNOSIS — J342 Deviated nasal septum: Secondary | ICD-10-CM | POA: Insufficient documentation

## 2024-04-21 DIAGNOSIS — J324 Chronic pansinusitis: Secondary | ICD-10-CM | POA: Insufficient documentation

## 2024-04-21 DIAGNOSIS — Z630 Problems in relationship with spouse or partner: Secondary | ICD-10-CM | POA: Diagnosis not present

## 2024-04-21 DIAGNOSIS — F4329 Adjustment disorder with other symptoms: Secondary | ICD-10-CM | POA: Diagnosis not present

## 2024-04-21 NOTE — Progress Notes (Signed)
 [facility] Counselor/Therapist Progress Note  Patient ID: Timothy David, MRN: [id number],    Date: 04/21/2024  Time Spent: 50 min Caregility video; [name] is home in private & Provider working remotely from [employer]. [name] is aware of risks/limitations of telehealth & consents to Tx today.  Time In: 3:00pm Time Out: 3:50pm   Treatment Type: [name] Th  Reported Symptoms: Elevated anx/dep  Mental Status Exam: Appearance:  Casual     Behavior: Appropriate and Sharing  Motor: Normal  Speech/Language:  Clear and Coherent  Affect: Appropriate  Mood: anxious  Thought process: normal  Thought content:   WNL  Sensory/Perceptual disturbances:   WNL  Orientation: oriented to person, place, time/date, and situation  Attention: Good  Concentration: Good  Memory: WNL  Fund of knowledge:  Good  Insight:   Good  Judgment:  Good  Impulse Control: Good   Risk Assessment: Danger to Self:  No Self-injurious Behavior: No Danger to Others: No Duty to Warn:no Physical Aggression / Violence:No  Access to Firearms a concern: No  Gang Involvement:No   Subjective: Pt & Wife are discussing their past wknd trip to a Cabin on the [place] in [place]. They enjoyed the trip. Wife discussed a moment when she was tearful prior to intimacy & we discussed this @ length. Focus on the trust issues Wife has & suggestions for the [name] to improve communication; verbally & non-verbally to inc intimacy & ea Spouse's willingness to be vulnerable. Emphasized to [name] they know the relationship better than anyone.  Interventions: [name] Th using Watson's Sexual Intimacy format  Diagnosis:Adjustment disorder with other symptom  Marital stress  Plan: [name] will stretch outside their usual comfort zones to inc productive communication. The will watch more closely for cues that indicate their Partner's needs. They will communicate more freely about the intimacy they ea need & talk openly about it. Next  visit we will discuss these efforts & the impact it is making on the relationship.  Target Date:05/12/2024  Progress: 7  Frequency: Once every 2 wks  Modality: [name] Th    [name], LMFT

## 2024-04-21 NOTE — Progress Notes (Signed)
 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  He was treated with multiple courses of antibiotics and steroids.  His sinus CT scan in July 2025 showed mild maxillary and ethmoid mucosal disease.  He has also noted chronic postnasal drainage.  He uses Flonase and Claritin intermittently.  He has no previous ENT surgery.  Currently he denies any facial pain, fever, or visual change.  Past Medical History:  Diagnosis Date   Asthma    exercise - induced   Dyslipidemia    ED (erectile dysfunction)    Hearing loss, bilateral    Hypertension, benign    Tibialis posterior tendinitis 12/21/2013   Documented on US  Tx with NTG     Past Surgical History:  Procedure Laterality Date   FINGER SURGERY     left index finger dislocation   KNEE ARTHROSCOPY W/ ACL RECONSTRUCTION  1990   SPHINCTEROTOMY     perirectal abscess incision and drainage    Family History  Problem Relation Age of Onset   Cancer Mother        lung   Heart disease Father    Hypertension Father    Cancer Sister        breast   Other Sister        Takotsubo cardiomyopathy   Healthy Brother    Healthy Sister    Healthy Sister    Healthy Sister    Healthy Sister     Social History:  reports that he has quit smoking. His smoking use included cigarettes. He has a 1 pack-year smoking history. He has never used smokeless tobacco. He reports current alcohol use of about 2.0 standard drinks of alcohol per week. He reports that he does not use drugs.  Allergies: No Known Allergies  Prior to Admission medications   Medication Sig Start Date End Date Taking? Authorizing Provider  ezetimibe (ZETIA) 10 MG tablet Take 1 tablet by mouth daily. 03/31/22 04/18/24 Yes [provider]  finasteride (PROSCAR) 5 MG tablet Take 1 tablet by mouth daily. 02/05/21  Yes [provider]  fluorouracil  (EFUDEX ) 5 % cream Apply topically 2 (two) times daily. 12/03/21  Yes Sheffield, Kelli R, PA-C  ipratropium (ATROVENT) 0.06 % nasal spray Place 2 sprays into both nostrils 2 (two) times daily as needed (drainage). 04/18/24  Yes Karis Clunes, MD  rosuvastatin (CRESTOR) 20 MG tablet Take 20 mg by mouth at bedtime. 11/19/21  Yes [provider]  tadalafil (CIALIS) 5 MG tablet Take by mouth. 02/18/21  Yes [provider]  tamsulosin (FLOMAX) 0.4 MG CAPS capsule Take 0.4 mg by mouth.   Yes [provider]  terbinafine  (LAMISIL ) 250 MG tablet Take 1 tablet (250 mg total) by mouth daily. 10/06/22  Yes Janit Thresa HERO, DPM  zaleplon (SONATA) 10 MG capsule Take 10 mg by mouth at bedtime as needed for sleep.   Yes [provider]  aspirin EC 81 MG tablet Take by mouth.    [provider]  finasteride (PROSCAR) 5 MG tablet Take 5 mg by mouth daily.    [provider]  pravastatin (PRAVACHOL) 10 MG tablet Take 10 mg by mouth daily.    [provider]  SILDENAFIL CITRATE PO Take 20 mg by mouth. TAKE 1-5 TABS BY MOUTH DAILY PRN    [provider]    Blood pressure 132/80, pulse 72, temperature 98.2 F (  36.8 C), temperature source Oral, height 5' 9 (1.753 m), weight 175 lb (79.4 kg), SpO2 94%. Exam: General: Communicates without difficulty, well nourished, no acute distress. Head: Normocephalic, no evidence injury, no tenderness, facial buttresses intact without stepoff. Face/sinus: No tenderness to palpation and percussion. Facial movement is normal and symmetric. Eyes: PERRL, EOMI. No scleral icterus, conjunctivae clear. Neuro: CN II exam reveals vision grossly intact.  No nystagmus at any point of gaze. Ears: Auricles well formed without lesions.  Ear canals are intact without mass or lesion.  No erythema or edema is appreciated.  The TMs are intact without fluid. Nose: External evaluation reveals normal support and skin without  lesions.  Dorsum is intact.  Anterior rhinoscopy reveals congested mucosa over anterior aspect of inferior turbinates and intact septum.  No purulence noted. Oral:  Oral cavity and oropharynx are intact, symmetric, without erythema or edema.  Mucosa is moist without lesions. Neck: Full range of motion without pain.  There is no significant lymphadenopathy.  No masses palpable.  Thyroid bed within normal limits to palpation.  Parotid glands and submandibular glands equal bilaterally without mass.  Trachea is midline. Neuro:  CN 2-12 grossly intact.   Procedure:  Flexible Nasal Endoscopy: Description: Risks, benefits, and alternatives of flexible endoscopy were explained to the patient.  Specific mention was made of the risk of throat numbness with difficulty swallowing, possible bleeding from the nose and mouth, and pain from the procedure.  The patient gave oral consent to proceed.  The flexible scope was inserted into the right nasal cavity.  Endoscopy of the interior nasal cavity, superior, inferior, and middle meatus was performed. The sphenoid-ethmoid recess was examined. Edematous mucosa was noted.  No polyp, mass, or lesion was appreciated. Nasal septal deviation noted. Olfactory cleft was clear.  Nasopharynx was clear.  Turbinates were hypertrophied but without mass.  The procedure was repeated on the contralateral side with similar findings.  The patient tolerated the procedure well.   Assessment: 1.  Recurrent rhinosinusitis, with nasal mucosal congestion, nasal septal deviation, and bilateral inferior turbinate hypertrophy.  No acute infection or purulent drainage is noted today. 2.  Chronic postnasal drainage.  Plan: 1.  The physical exam and nasal endoscopy findings are reviewed with the patient. 2.  The patient is reassured and no acute infection is noted today. 3.  Flonase nasal spray 2 sprays each nostril daily.  The importance of consistent daily use is discussed. 4.  Nasal saline  irrigation is encouraged. 5.  Atrovent to treat the postnasal drainage. 6.  The patient will return for immediate reevaluation if he experiences any acute sinusitis.  He is instructed to call our office with any new infection.  Perrin Gens W Dann Ventress 04/21/2024, 8:09 AM

## 2024-04-25 DIAGNOSIS — R351 Nocturia: Secondary | ICD-10-CM | POA: Diagnosis not present

## 2024-04-25 DIAGNOSIS — R3915 Urgency of urination: Secondary | ICD-10-CM | POA: Diagnosis not present

## 2024-04-25 DIAGNOSIS — Z125 Encounter for screening for malignant neoplasm of prostate: Secondary | ICD-10-CM | POA: Diagnosis not present

## 2024-04-25 DIAGNOSIS — R399 Unspecified symptoms and signs involving the genitourinary system: Secondary | ICD-10-CM | POA: Diagnosis not present

## 2024-04-25 DIAGNOSIS — N5201 Erectile dysfunction due to arterial insufficiency: Secondary | ICD-10-CM | POA: Diagnosis not present

## 2024-04-26 DIAGNOSIS — M545 Low back pain, unspecified: Secondary | ICD-10-CM | POA: Diagnosis not present

## 2024-04-26 DIAGNOSIS — M25559 Pain in unspecified hip: Secondary | ICD-10-CM | POA: Diagnosis not present

## 2024-04-26 DIAGNOSIS — M542 Cervicalgia: Secondary | ICD-10-CM | POA: Diagnosis not present

## 2024-05-04 DIAGNOSIS — M542 Cervicalgia: Secondary | ICD-10-CM | POA: Diagnosis not present

## 2024-05-04 DIAGNOSIS — M25559 Pain in unspecified hip: Secondary | ICD-10-CM | POA: Diagnosis not present

## 2024-05-04 DIAGNOSIS — M545 Low back pain, unspecified: Secondary | ICD-10-CM | POA: Diagnosis not present

## 2024-05-05 ENCOUNTER — Ambulatory Visit (INDEPENDENT_AMBULATORY_CARE_PROVIDER_SITE_OTHER): Admitting: Behavioral Health

## 2024-05-05 DIAGNOSIS — F4329 Adjustment disorder with other symptoms: Secondary | ICD-10-CM | POA: Diagnosis not present

## 2024-05-05 DIAGNOSIS — Z630 Problems in relationship with spouse or partner: Secondary | ICD-10-CM

## 2024-05-05 NOTE — Progress Notes (Signed)
 [facility] Counselor/Therapist Progress Note  Patient ID: Timothy David, MRN: [id number],    Date: 05/05/2024  Time Spent: 50 min Caregility video; [name] is home in private & Provider working remotely from [employer]. [name] is aware of the risks/limitations of telehealth & consents to Tx today. Time In: 3:00pm Time Out: 3:50pm   Treatment Type: [name] Th  Reported Symptoms: Some dec in marital stress today; mixture of anxiety for both  MSE for both [name] & Wife today:  Mental Status Exam: Appearance:  Casual     Behavior: Appropriate, Sharing, and Motivated  Motor: Normal  Speech/Language:  Clear and Coherent  Affect: Appropriate  Mood: normal  Thought process: normal  Thought content:   WNL  Sensory/Perceptual disturbances:   WNL  Orientation: oriented to person, place, time/date, and situation  Attention: Good  Concentration: Good  Memory: WNL  Fund of knowledge:  Good  Insight:   Good  Judgment:  Good  Impulse Control: Good   Risk Assessment: Danger to Self:  No Self-injurious Behavior: No Danger to Others: No Duty to Warn:no Physical Aggression / Violence:No  Access to Firearms a concern: No  Gang Involvement:No   Subjective: [name] in good spirits today. They have exp'd a good few wks & are learning to communicate more productively. We discussed a tearful incident mentioned in last week's session to further assist [name] to inc their understanding of ea other & deepen their intimacy.   Interventions: Family Systems and [name] work using, Emotional regulation skills & self-soothing techniques  Diagnosis:Adjustment disorder with other symptom  Marital stress  Plan: [name] & Javin are making the adjustments needed to understand & improve their communication patterns. All 3 children have moved out of the Home & they are taking steps to lean into the marriage w/more emphasis on ea other's needs. We will cont our exploration of the needs of ea Partner & how they  can strengthen their bond & commitment to ea other. They will secure copies of 'Hold Me Tight' by Beverley Louder, PhD & watch the suggested 15 min video that summarizes the book.We will discuss next visit any takeaways they share.  Target Date: 05/28/2024  Progress: 6  Frequency: Once every 2-3 wks  Modality: [name] Th  [name], LMFT

## 2024-05-11 DIAGNOSIS — M545 Low back pain, unspecified: Secondary | ICD-10-CM | POA: Diagnosis not present

## 2024-05-11 DIAGNOSIS — M25559 Pain in unspecified hip: Secondary | ICD-10-CM | POA: Diagnosis not present

## 2024-05-11 DIAGNOSIS — M542 Cervicalgia: Secondary | ICD-10-CM | POA: Diagnosis not present

## 2024-05-18 ENCOUNTER — Ambulatory Visit: Admitting: Behavioral Health

## 2024-05-18 DIAGNOSIS — Z630 Problems in relationship with spouse or partner: Secondary | ICD-10-CM | POA: Diagnosis not present

## 2024-05-18 DIAGNOSIS — F4329 Adjustment disorder with other symptoms: Secondary | ICD-10-CM

## 2024-05-18 NOTE — Progress Notes (Signed)
 [facility] Counselor/Therapist Progress Note  Patient ID: Timothy David, MRN: [id number],    Date: 05/18/2024  Time Spent: 50 min In Person @ [facility] Time In: 9:00am Time Out: 9:50am   Treatment Type: [name] Th  Reported Symptoms: Elevated anx/dep & some marital stress  MSE based on [name] today:  Mental Status Exam: Appearance:  Well Groomed and dressed for work     Behavior: Appropriate and Sharing  Motor: Normal  Speech/Language:  Clear and Coherent  Affect: Appropriate  Mood: anxious  Thought process: normal  Thought content:   WNL  Sensory/Perceptual disturbances:   WNL  Orientation: oriented to person, place, time/date, and situation  Attention: Good  Concentration: Good  Memory: WNL  Fund of knowledge:  Good  Insight:   Good  Judgment:  Good  Impulse Control: Good   Risk Assessment: Danger to Self:  No Self-injurious Behavior: No Danger to Others: No Duty to Warn:no Physical Aggression / Violence:No  Access to Firearms a concern: No  Gang Involvement:No   Subjective: [name] is upbeat today & have completed a portion of the tasks requested last session. They have worked through the [place] of the 'Hold Me Tight Workbook'.     Interventions: EFT - Johnson; Elements of Attachment & Presence     A - Accessible     R - Responsive     E - Engaged  Diagnosis:Adjustment disorder with other symptom  Marital stress  Plan: [name] & Rui will cont to complete the Workbook since this has helped open up their communication. They will slow down & dig deeper into the questions. [name] will try to reduce/eliminate her reminders to [place] about different taks around the home, as she is able. Kazim will try to match his beh to his verbalizations so [name] knows he cares about what she thinks.  Target Date: 06/12/2024  Progress: 7  Frequency: Once every 2-3 wks  Modality: [name] Th  [name], LMFT

## 2024-05-20 ENCOUNTER — Ambulatory Visit: Admitting: Behavioral Health

## 2024-05-25 DIAGNOSIS — M542 Cervicalgia: Secondary | ICD-10-CM | POA: Diagnosis not present

## 2024-05-25 DIAGNOSIS — M545 Low back pain, unspecified: Secondary | ICD-10-CM | POA: Diagnosis not present

## 2024-05-25 DIAGNOSIS — M25559 Pain in unspecified hip: Secondary | ICD-10-CM | POA: Diagnosis not present

## 2024-06-01 ENCOUNTER — Ambulatory Visit: Admitting: Behavioral Health

## 2024-06-01 DIAGNOSIS — F4329 Adjustment disorder with other symptoms: Secondary | ICD-10-CM

## 2024-06-01 DIAGNOSIS — M545 Low back pain, unspecified: Secondary | ICD-10-CM | POA: Diagnosis not present

## 2024-06-01 DIAGNOSIS — M25559 Pain in unspecified hip: Secondary | ICD-10-CM | POA: Diagnosis not present

## 2024-06-01 DIAGNOSIS — Z630 Problems in relationship with spouse or partner: Secondary | ICD-10-CM

## 2024-06-01 DIAGNOSIS — M542 Cervicalgia: Secondary | ICD-10-CM | POA: Diagnosis not present

## 2024-06-01 NOTE — Progress Notes (Signed)
 [facility] Counselor/Therapist Progress Note  Patient ID: Timothy David, MRN: [id number],    Date: 06/01/2024  Time Spent: 50 min Caregility video; [name] is home in private & Provider working remotely from [employer]. [name] is aware of the risks/limitations of telehealth & consents to Tx today.  Time In: 9:00am Time Out: 9:50am   Treatment Type: Individual Therapy  Reported Symptoms: Some reduction in anx/dep & marital stress  Mental Status Exam: Appearance:  Casual     Behavior: Appropriate and Sharing  Motor: Normal  Speech/Language:  Clear and Coherent  Affect: Appropriate  Mood: normal  Thought process: normal  Thought content:   WNL  Sensory/Perceptual disturbances:   WNL  Orientation: oriented to person, place, time/date, and situation  Attention: Good  Concentration: Good  Memory: WNL  Fund of knowledge:  Good  Insight:   Good  Judgment:  Good  Impulse Control: Good   Risk Assessment: Danger to Self:  No Self-injurious Behavior: No Danger to Others: No Duty to Warn:no Physical Aggression / Violence:No  Access to Firearms a concern: No  Gang Involvement:No   Subjective: [name] is upbeat today & ready for the Thanksgiving Holiday w/Family. They will be intentional about the planning they do for next week, doing this prior to the wknd when many plans will be in play for them both.    Interventions: Family Systems  Diagnosis:Adjustment disorder with other symptom  Marital stress  Plan: Duell & [name] will make efforts for the Thanksgiving Holiday w/their children to be intentional w/low stress levels asap. They look forward to the children being back in the Home & having quality time w/ea of them. They will share the Holiday duties so ea Partner can have a relaxing day/wknd & keep their shared time together protected. As needs for their chidlren arise, they will try to promote the functioning of the children & not jump to solve their problems so quick.    Target Date: 06/27/2024  Progress: 7  Frequency: Once every 2-3 wks  Modality: [name] Th  [name], LMFT

## 2024-06-07 DIAGNOSIS — M542 Cervicalgia: Secondary | ICD-10-CM | POA: Diagnosis not present

## 2024-06-07 DIAGNOSIS — M545 Low back pain, unspecified: Secondary | ICD-10-CM | POA: Diagnosis not present

## 2024-06-07 DIAGNOSIS — M25559 Pain in unspecified hip: Secondary | ICD-10-CM | POA: Diagnosis not present

## 2024-06-13 DIAGNOSIS — Z1212 Encounter for screening for malignant neoplasm of rectum: Secondary | ICD-10-CM | POA: Diagnosis not present

## 2024-06-15 ENCOUNTER — Ambulatory Visit: Admitting: Behavioral Health

## 2024-06-15 ENCOUNTER — Encounter (INDEPENDENT_AMBULATORY_CARE_PROVIDER_SITE_OTHER): Payer: Self-pay | Admitting: Otolaryngology

## 2024-06-15 ENCOUNTER — Ambulatory Visit (INDEPENDENT_AMBULATORY_CARE_PROVIDER_SITE_OTHER): Admitting: Otolaryngology

## 2024-06-15 VITALS — BP 118/73 | HR 62 | Temp 98.3°F | Ht 69.0 in | Wt 176.0 lb

## 2024-06-15 DIAGNOSIS — J329 Chronic sinusitis, unspecified: Secondary | ICD-10-CM | POA: Diagnosis not present

## 2024-06-15 DIAGNOSIS — M545 Low back pain, unspecified: Secondary | ICD-10-CM | POA: Diagnosis not present

## 2024-06-15 DIAGNOSIS — R0982 Postnasal drip: Secondary | ICD-10-CM

## 2024-06-15 DIAGNOSIS — M542 Cervicalgia: Secondary | ICD-10-CM | POA: Diagnosis not present

## 2024-06-15 DIAGNOSIS — J343 Hypertrophy of nasal turbinates: Secondary | ICD-10-CM

## 2024-06-15 DIAGNOSIS — J342 Deviated nasal septum: Secondary | ICD-10-CM | POA: Diagnosis not present

## 2024-06-15 DIAGNOSIS — J324 Chronic pansinusitis: Secondary | ICD-10-CM

## 2024-06-15 DIAGNOSIS — M25559 Pain in unspecified hip: Secondary | ICD-10-CM | POA: Diagnosis not present

## 2024-06-15 NOTE — Progress Notes (Signed)
 Patient ID: Timothy David, male   DOB: [birth year], 68 y.o.   MRN: [id number]  Follow up: Recurrent sinusitis, chronic nasal drainage  History of Present Illness Timothy David is a 68 year old male who returns today for his follow-up evaluation.  He was previously seen for recurrent rhinosinusitis, nasal septal deviation, bilateral inferior turbinate hypertrophy, and chronic postnasal drainage.  He was treated with Flonase, Atrovent , and nasal saline irrigation.  He has experienced improvement in his sinus condition, with better movement of secretions and more spontaneous pressure equalization. Occasionally, he experiences fullness and a sensation of fluid in his ears. He inquires about the intermittent use of Afrin, which he uses periodically for relief.  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  Currently he denies any facial pain, fever, or visual change.   Exam: General: Communicates without difficulty, well nourished, no acute distress. Head: Normocephalic, no evidence injury, no tenderness, facial buttresses intact without stepoff. Face/sinus: No tenderness to palpation and percussion. Facial movement is normal and symmetric. Eyes: PERRL, EOMI. No scleral icterus, conjunctivae clear. Neuro: CN II exam reveals vision grossly intact.  No nystagmus at any point of gaze. Ears: Auricles well formed without lesions.  Ear canals are intact without mass or lesion.  No erythema or edema is appreciated.  The TMs are intact without fluid. Nose: External evaluation reveals normal support and skin without lesions.  Dorsum is intact.  Anterior rhinoscopy reveals congested mucosa over anterior aspect of inferior turbinates and intact septum.  No purulence noted. Oral:  Oral cavity and oropharynx are intact, symmetric, without erythema or edema.  Mucosa is moist without lesions. Neck: Full range of motion without pain.  There  is no significant lymphadenopathy.  No masses palpable.  Thyroid bed within normal limits to palpation.  Parotid glands and submandibular glands equal bilaterally without mass.  Trachea is midline. Neuro:  CN 2-12 grossly intact.    Assessment and Plan Assessment & Plan Recurrent rhinosinusitis Improvement in sinus symptoms with better secretion clearance and spontaneous pressure equalization. Occasional fullness and fluid sensation present. No fluid observed in the ear. Deviated septum remains unchanged. Mucosal inflammation decreased compared to last visit. - Continue Flonase daily to reduce baseline inflammation. - Use Afrin intermittently, not exceeding 2-3 days at a time. - Perform saline nasal irrigation with a bottle and salt package as needed, especially during exposure to irritants like Christmas tree. - Will schedule follow-up in spring season (March or April) to assess response to pollen exposure.  Chronic postnasal drainage Improvement in postnasal drainage symptoms with decreased mucosal inflammation. - Continue current management with Flonase, Atrovent , and saline irrigation as needed.

## 2024-06-17 ENCOUNTER — Ambulatory Visit: Admitting: Behavioral Health

## 2024-06-17 DIAGNOSIS — F4329 Adjustment disorder with other symptoms: Secondary | ICD-10-CM | POA: Diagnosis not present

## 2024-06-17 DIAGNOSIS — Z630 Problems in relationship with spouse or partner: Secondary | ICD-10-CM | POA: Diagnosis not present

## 2024-06-17 NOTE — Progress Notes (Signed)
 [facility] Counselor/Therapist Progress Note  Patient ID: MAXINE HUYNH, MRN: [id number],    Date: 06/17/2024  Time Spent: 50 min Caregility video; [name] is home in private & Provider working remotely from [employer]. [name] is aware of the risks/limitations of telehealth & consents to Tx today. Time In: 9:00am Time Out: 9:50am   Treatment Type: [name] Th  Reported Symptoms: Some reduction in anx/dep  Mental Status Exam: Appearance:  Casual     Behavior: Appropriate and Sharing  Motor: Normal  Speech/Language:  Clear and Coherent  Affect: Appropriate  Mood: normal  Thought process: normal  Thought content:   WNL  Sensory/Perceptual disturbances:   WNL  Orientation: oriented to person, place, time/date, and situation  Attention: Good  Concentration: Good  Memory: WNL  Fund of knowledge:  Good  Insight:   Good  Judgment:  Good  Impulse Control: Good   Risk Assessment: Danger to Self:  No Self-injurious Behavior: No Danger to Others: No Duty to Warn:no Physical Aggression / Violence:No  Access to Firearms a concern: No  Gang Involvement:No   Subjective: Family had a positive Thanksgiving Day exp w/the kids & their other guests. The work around [employer] was not equally distributed. This caused issues on Sat after Christmas as the irritation culminated.   Interventions: Family Systems  Diagnosis:Adjustment disorder with other symptom  Marital stress  Plan: Viktoria & [name] will cont their efforts to be closer & have a deeper emot'l connection. Layken will be more present for [name]'s cues that indicate connecting is important & [name] will try to be more specific so Travell dsn't have to read btwn the lines. They will share more stories w/ea other that have meaning in their 6yr marriage. They will think in terms of 'What would you share w/a trusted friend about your relationship-what would be important for you to let this friend know about your  trust/commitment/presence in the marriage'?  Audrey & [name] will begin to share w/the children about the Christmas Holiday planning so they will not be disappointed. If they help plan, they are unlikely to complain.   [name] & Zhion will try to have more fun night outings where they enjoy ea other & lighten the mood so they can enjoy ea other fully. This does not always involve sex, so they can both be aware of this to take the pressure off the area of intimacy.  Target Date: 07/12/2024  Progress: 7  Frequency: Once every 2-3 wks  Modality: [name] Th  [name], LMFT

## 2024-06-22 DIAGNOSIS — M542 Cervicalgia: Secondary | ICD-10-CM | POA: Diagnosis not present

## 2024-06-22 DIAGNOSIS — M25559 Pain in unspecified hip: Secondary | ICD-10-CM | POA: Diagnosis not present

## 2024-06-22 DIAGNOSIS — M545 Low back pain, unspecified: Secondary | ICD-10-CM | POA: Diagnosis not present

## 2024-06-29 ENCOUNTER — Ambulatory Visit: Admitting: Behavioral Health

## 2024-06-29 DIAGNOSIS — Z630 Problems in relationship with spouse or partner: Secondary | ICD-10-CM | POA: Diagnosis not present

## 2024-06-29 DIAGNOSIS — F4329 Adjustment disorder with other symptoms: Secondary | ICD-10-CM | POA: Diagnosis not present

## 2024-06-29 NOTE — Progress Notes (Signed)
 [facility] Counselor/Therapist Progress Note  Patient ID: Timothy David, MRN: [id number],    Date: 06/29/2024  Time Spent: 50 min Caregility video; [name] is @ home in private & Provider working remotely from [employer]. [name] is aware of the risks/limitations of telehealth & consents to Tx today. Time In: 11:00am Time Out: 11:50am  Treatment Type: [name] Th  Reported Symptoms: Cont'd reduction in anx/dep & marital stress as [name] strives to learn & grow  MSE reported on both Adults today:  Mental Status Exam: Appearance:  Casual     Behavior: Appropriate and Sharing  Motor: Normal  Speech/Language:  Clear and Coherent  Affect: Appropriate  Mood: normal  Thought process: normal  Thought content:   WNL  Sensory/Perceptual disturbances:   WNL  Orientation: oriented to person, place, time/date, and situation  Attention: Good  Concentration: Good  Memory: WNL  Fund of knowledge:  Good  Insight:   Good  Judgment:  Good  Impulse Control: Good   Risk Assessment: Danger to Self:  No Self-injurious Behavior: No Danger to Others: No Duty to Warn:no Physical Aggression / Violence:No  Access to Firearms a concern: No  Gang Involvement:No   Subjective:  Pts report several small disagreements over the past few wks. [name] discussed the reciprocity they need to build through vulnerability.   Interventions: Family Systems and [name] Th using EFT  Diagnosis:Adjustment disorder with other symptom  Marital stress  Plan: Timothy David & Timothy David will think more about the meaning they make of a 75 yr marriage & what they might share w/a trusted friend about their relationship. The will engage their Adult Children in the planning of Christmas so it is not so stressful. They will plan more fun outings that promote closeness & intimacy.   Target Date: 07/28/2024  Progress: 6  Frequency: Once every 2-3 wks  Modality: [name] Th  Timothy David Ling, LMFT

## 2024-07-18 ENCOUNTER — Ambulatory Visit (INDEPENDENT_AMBULATORY_CARE_PROVIDER_SITE_OTHER): Admitting: Behavioral Health

## 2024-07-18 DIAGNOSIS — Z630 Problems in relationship with spouse or partner: Secondary | ICD-10-CM

## 2024-07-18 DIAGNOSIS — F4329 Adjustment disorder with other symptoms: Secondary | ICD-10-CM

## 2024-07-20 NOTE — Addendum Note (Signed)
 Addended by: Lisset Ketchem L on: 07/20/2024 09:55 AM   Modules accepted: Level of Service

## 2024-08-01 ENCOUNTER — Ambulatory Visit: Admitting: Behavioral Health

## 2024-08-01 DIAGNOSIS — F4329 Adjustment disorder with other symptoms: Secondary | ICD-10-CM | POA: Diagnosis not present

## 2024-08-01 DIAGNOSIS — Z630 Problems in relationship with spouse or partner: Secondary | ICD-10-CM

## 2024-08-01 NOTE — Progress Notes (Unsigned)
"    [facility] Counselor/Therapist Progress Note  Patient ID: WASIL WOLKE, MRN: [id number],    Date: 08/02/2024  Time Spent: 50 min Caregility video; [name] is in the home w/privacy & Provider working remotely from [employer]. [name] aware of the risks/limitations of telehealth & consents to Tx today. Time In: 10:00am Time Out:  10:50am  Treatment Type: [name] Th  Reported Symptoms: Reduction in marital stress & conflict   Mental Status Exam: Appearance:  Casual and Neat     Behavior: Appropriate and Sharing  Motor: Normal  Speech/Language:  Clear and Coherent  Affect: Appropriate  Mood: normal  Thought process: normal  Thought content:   WNL  Sensory/Perceptual disturbances:   WNL  Orientation: oriented to person, place, time/date, and situation  Attention: Good  Concentration: Good  Memory: WNL  Fund of knowledge:  Good  Insight:   Good  Judgment:  Good  Impulse Control: Good   Risk Assessment: Danger to Self:  No Self-injurious Behavior: No Danger to Others: No Duty to Warn:no Physical Aggression / Violence:No  Access to Firearms a concern: No  Gang Involvement:No   Subjective: [name] report on the needs of their Adult Children today & the impact their cont'd efforts w/communication have revealed. They have reached a new level of vulnerability & honesty tgthr & want to have their relationship deepen further.    Interventions: Family Systems and [name] work on Administrator  Diagnosis:Adjustment disorder with other symptom  Marital stress  Plan: [name] will use the communication psychoedu & suggestions provided today to cont to improve how they interact. They will track the progress they make & report back @ our next session.  [name] will stay united in their Parenting as they assist the 3 Adult Children to adjust to moving out of the home & securing careers.  Target Date: 08/28/2024  Progress: 7  Frequency: Once every 2-3 wks as sessions &  [name] indicate  Modality: [name] Th  [name], LMFT    "

## 2024-08-15 ENCOUNTER — Ambulatory Visit: Admitting: Behavioral Health

## 2024-08-15 DIAGNOSIS — F4329 Adjustment disorder with other symptoms: Secondary | ICD-10-CM

## 2024-08-15 DIAGNOSIS — Z630 Problems in relationship with spouse or partner: Secondary | ICD-10-CM

## 2024-08-15 NOTE — Progress Notes (Unsigned)
"    [facility] Counselor/Therapist Progress Note  Patient ID: Timothy David, MRN: [id number],    Date: 08/15/2024  Time Spent: 50 min Caregility video; [name] is in the home w/privacy & Provider working remotely from [employer]. [name] is aware of the risks/limitations of telehealth & consents to Tx today. Time In: 2:00pm Time Out: 2:50pm   Treatment Type: [name] Th  Reported Symptoms: Elevated concerns for the series 'Stranger Things'  MSE is based on Pt today:   Mental Status Exam: Appearance:  Casual     Behavior: Appropriate and Sharing  Motor: Normal  Speech/Language:  Clear and Coherent  Affect: Appropriate  Mood: normal  Thought process: normal  Thought content:   WNL  Sensory/Perceptual disturbances:   WNL  Orientation: oriented to person, place, time/date, and situation  Attention: Good  Concentration: Good  Memory: WNL  Fund of knowledge:  Good  Insight:   Good  Judgment:  Good  Impulse Control: Good   Risk Assessment: Danger to Self:  No Self-injurious Behavior: No Danger to Others: No Duty to Warn:no Physical Aggression / Violence:No  Access to Firearms a concern: No  Gang Involvement:No   Subjective: Pt & Wife describe how the kids are doing.   Interventions: Family Systems and [name] Th using Gottman & EFT  Diagnosis:Adjustment disorder with other symptom  Marital stress  Plan: Timothy David & Timothy David will _____________  Target Date: 09/11/2024  Progress: 7  Frequency: Once every 2-3 wks  Modality: [name] Th  [name], LMFT    "

## 2024-09-05 ENCOUNTER — Ambulatory Visit: Admitting: Behavioral Health

## 2024-10-18 ENCOUNTER — Ambulatory Visit (INDEPENDENT_AMBULATORY_CARE_PROVIDER_SITE_OTHER): Admitting: Otolaryngology
